# Patient Record
Sex: Female | Born: 1945 | Race: White | Hispanic: No | Marital: Married | Smoking: Former smoker
Health system: Southern US, Community
[De-identification: ages and names within clinical notes are randomized; demographics above are authoritative.]

## PROBLEM LIST (undated history)

## (undated) DIAGNOSIS — J45909 Unspecified asthma, uncomplicated: Secondary | ICD-10-CM

## (undated) DIAGNOSIS — I10 Essential (primary) hypertension: Secondary | ICD-10-CM

## (undated) HISTORY — DX: Essential (primary) hypertension: I10

## (undated) HISTORY — DX: Unspecified asthma, uncomplicated: J45.909

---

## 1996-04-06 HISTORY — PX: BREAST BIOPSY: SHX20

## 2004-12-17 ENCOUNTER — Ambulatory Visit: Payer: Self-pay | Admitting: Unknown Physician Specialty

## 2005-04-13 ENCOUNTER — Ambulatory Visit: Payer: Self-pay | Admitting: Gastroenterology

## 2005-12-31 ENCOUNTER — Ambulatory Visit: Payer: Self-pay | Admitting: Unknown Physician Specialty

## 2006-07-05 ENCOUNTER — Ambulatory Visit: Payer: Self-pay | Admitting: Family Medicine

## 2007-01-06 ENCOUNTER — Ambulatory Visit: Payer: Self-pay | Admitting: Unknown Physician Specialty

## 2007-02-09 ENCOUNTER — Ambulatory Visit: Payer: Self-pay | Admitting: Orthopedic Surgery

## 2007-02-18 ENCOUNTER — Ambulatory Visit: Payer: Self-pay | Admitting: Orthopedic Surgery

## 2008-01-13 ENCOUNTER — Ambulatory Visit: Payer: Self-pay

## 2008-04-06 HISTORY — PX: THUMB ARTHROSCOPY: SHX2509

## 2009-01-14 ENCOUNTER — Ambulatory Visit: Payer: Self-pay | Admitting: Unknown Physician Specialty

## 2010-01-16 ENCOUNTER — Ambulatory Visit: Payer: Self-pay | Admitting: Unknown Physician Specialty

## 2010-09-08 ENCOUNTER — Ambulatory Visit: Payer: Self-pay | Admitting: Otolaryngology

## 2011-01-20 ENCOUNTER — Ambulatory Visit: Payer: Self-pay | Admitting: Unknown Physician Specialty

## 2012-01-12 ENCOUNTER — Ambulatory Visit: Payer: Self-pay | Admitting: Family Medicine

## 2013-01-04 ENCOUNTER — Ambulatory Visit: Payer: Self-pay | Admitting: Family Medicine

## 2013-01-26 ENCOUNTER — Ambulatory Visit: Payer: Self-pay | Admitting: Family Medicine

## 2014-01-01 ENCOUNTER — Ambulatory Visit (INDEPENDENT_AMBULATORY_CARE_PROVIDER_SITE_OTHER): Payer: Federal, State, Local not specified - PPO | Admitting: Internal Medicine

## 2014-01-01 ENCOUNTER — Ambulatory Visit (INDEPENDENT_AMBULATORY_CARE_PROVIDER_SITE_OTHER)
Admission: RE | Admit: 2014-01-01 | Discharge: 2014-01-01 | Disposition: A | Discharge status: Home / Self Care | Payer: Federal, State, Local not specified - PPO | Source: Ambulatory Visit | Attending: Internal Medicine | Admitting: Internal Medicine

## 2014-01-01 ENCOUNTER — Encounter: Payer: Self-pay | Admitting: Internal Medicine

## 2014-01-01 VITALS — BP 140/80 | HR 75 | Temp 98.4°F | Ht 64.5 in | Wt 167.4 lb

## 2014-01-01 DIAGNOSIS — R059 Cough, unspecified: Secondary | ICD-10-CM

## 2014-01-01 DIAGNOSIS — R058 Other specified cough: Secondary | ICD-10-CM

## 2014-01-01 DIAGNOSIS — R05 Cough: Secondary | ICD-10-CM

## 2014-01-01 NOTE — Patient Instructions (Signed)
Stay on omeprazole (prilosec) 40 mg before supper  GERD (REFLUX)  is an extremely common cause of respiratory symptoms, many times with no significant heartburn at all.    It can be treated with medication, but also with lifestyle changes including avoidance of late meals, excessive alcohol, smoking cessation, and avoid fatty foods, chocolate, peppermint, colas, red wine, and acidic juices such as orange juice.  NO MINT OR MENTHOL PRODUCTS SO NO COUGH DROPS  USE SUGARLESS CANDY INSTEAD (jolley ranchers or Pharmacologist)  NO OIL BASED VITAMINS - use powdered substitutes.  Please see patient coordinator before you leave today  to schedule sinus CT   Please remember to go to the lab and x-ray department downstairs for your tests - we will call you with the results when they are available.  Delsym cough syrup is the best you can buy without a prescription

## 2014-01-01 NOTE — Progress Notes (Signed)
Quick Note:  Spoke with pt and notified of results per Dr. Wert. Pt verbalized understanding and denied any questions.  ______ 

## 2014-01-01 NOTE — Assessment & Plan Note (Addendum)
The most common causes of chronic cough in immunocompetent adults include the following: upper airway cough syndrome (UACS), previously referred to as postnasal drip syndrome (PNDS), which is caused by variety of rhinosinus conditions; (2) asthma; (3) GERD; (4) chronic bronchitis from cigarette smoking or other inhaled environmental irritants; (5) nonasthmatic eosinophilic bronchitis; and (6) bronchiectasis.   These conditions, singly or in combination, have accounted for up to 94% of the causes of chronic cough in prospective studies.   Other conditions have constituted no >6% of the causes in prospective studies These have included bronchogenic carcinoma, chronic interstitial pneumonia, sarcoidosis, left ventricular failure, ACEI-induced cough, and aspiration from a condition associated with pharyngeal dysfunction.    Chronic cough is often simultaneously caused by more than one condition. A single cause has been found from 38 to 82% of the time, multiple causes from 18 to 62%. Multiply caused cough has been the result of three diseases up to 42% of the time.       Based on hx and exam, this is most likely:  Classic Upper airway cough syndrome, so named because it's frequently impossible to sort out how much is  CR/sinusitis with freq throat clearing (which can be related to primary GERD)   vs  causing  secondary (" extra esophageal")  GERD from wide swings in gastric pressure that occur with throat clearing, often  promoting self use of mint and menthol lozenges that reduce the lower esophageal sphincter tone and exacerbate the problem further in a cyclical fashion.   These are the same pts (now being labeled as having "irritable larynx syndrome" by some cough centers) who not infrequently have a history of having failed to tolerate ace inhibitors,  dry powder inhalers or biphosphonates or report having atypical reflux symptoms that don't respond to standard doses of PPI , and are easily confused as  having aecopd or asthma flares by even experienced allergists/ pulmonologists.   The first step is to continue maximize acid suppression and  Complete the w/u with allergy profile and sinus ct then regroup if symptoms recur while on max diex/ acid suppression   See instructions for specific recommendations which were reviewed directly with the patient who was given a copy with highlighter outlining the key components.

## 2014-01-01 NOTE — Progress Notes (Signed)
   Subjective:    Patient ID: Zoe Vaughn, female    DOB: November 05, 1945  MRN: [id number]  HPI  6 yowf quit smoking 1985 with tendency to lingering cough with colds lasting a long time since around 2005 typically around Oct / nov lasting up to 2 months at a time and again Jan /feb persisted March 2015 and eval by ENT mid 12/18/13 by shoemaker > rx for reflux so self referred 01/01/2014 to pulmonary clinic   01/01/2014 1st [facility] office visit/ Cristofer Yaffe   Chief Complaint  Patient presents with  . Pulmonary Consult    Self referral. Pt c/o wheezing and cough x 1 yr- started on acid suppression med x 2 wks ago per ENT and symptoms improved.     Cough and wheezing resolved but were quite severe at times since March 2015 until rx with ppi and basically resolved now but concerned about her hoarseness and nasal congestion  No obvious patterns in day to day or daytime variabilty or assoc chronic cough or cp or chest tightness, subjective wheeze overt sinus or hb symptoms. No unusual exp hx or h/o childhood pna/ asthma or knowledge of premature birth.  Sleeping ok without nocturnal  or early am exacerbation  of respiratory  c/o's or need for noct saba. Also denies any obvious fluctuation of symptoms with weather or environmental changes or other aggravating or alleviating factors except as outlined above   Current Medications, Allergies, Complete Past Medical History, Past Surgical History, Family History, and Social History were reviewed in [facility] record.          Review of Systems  Constitutional: Negative for fever, chills and unexpected weight change.  HENT: Negative for congestion, dental problem, ear pain, nosebleeds, postnasal drip, rhinorrhea, sinus pressure, sneezing, sore throat, trouble swallowing and voice change.   Eyes: Negative for visual disturbance.  Respiratory: Negative for cough, choking and shortness of breath.   Cardiovascular: Negative for  chest pain and leg swelling.  Gastrointestinal: Negative for vomiting, abdominal pain and diarrhea.  Genitourinary: Negative for difficulty urinating.  Musculoskeletal: Negative for arthralgias.  Skin: Negative for rash.  Neurological: Negative for tremors, syncope and headaches.  Hematological: Does not bruise/bleed easily.       Objective:   Physical Exam  amb wf with strong nasal tone   Wt Readings from Last 3 Encounters:  01/01/14 167 lb 6.4 oz (75.932 kg)     HEENT: nl dentition,   and orophanx. L turbinates mod severe non-specific swelling. Nl external ear canals without cough reflex   NECK :  without JVD/Nodes/TM/ nl carotid upstrokes bilaterally   LUNGS: no acc muscle use, clear to A and P bilaterally without cough on insp or exp maneuvers   CV:  RRR  no s3 or murmur or increase in P2, no edema   ABD:  soft and nontender with nl excursion in the supine position. No bruits or organomegaly, bowel sounds nl  MS:  warm without deformities, calf tenderness, cyanosis or clubbing  SKIN: warm and dry without lesions    NEURO:  alert, approp, no deficits     CXR  01/01/2014 :   No active cardiopulmonary disease.       Assessment & Plan:

## 2014-01-03 ENCOUNTER — Ambulatory Visit (INDEPENDENT_AMBULATORY_CARE_PROVIDER_SITE_OTHER)
Admission: RE | Admit: 2014-01-03 | Discharge: 2014-01-03 | Disposition: A | Discharge status: Home / Self Care | Payer: Federal, State, Local not specified - PPO | Source: Ambulatory Visit | Attending: Internal Medicine | Admitting: Internal Medicine

## 2014-01-03 DIAGNOSIS — R05 Cough: Secondary | ICD-10-CM

## 2014-01-03 DIAGNOSIS — R058 Other specified cough: Secondary | ICD-10-CM

## 2014-01-03 DIAGNOSIS — R059 Cough, unspecified: Secondary | ICD-10-CM

## 2014-01-04 ENCOUNTER — Encounter: Payer: Self-pay | Admitting: Internal Medicine

## 2014-01-04 NOTE — Progress Notes (Signed)
Quick Note:  Spoke with pt and notified of results per Dr. Wert. Pt verbalized understanding and denied any questions.  ______ 

## 2014-01-08 ENCOUNTER — Telehealth: Payer: Self-pay | Admitting: Internal Medicine

## 2014-01-08 MED ORDER — AMOXICILLIN-POT CLAVULANATE 875-125 MG PO TABS: 1.0000 | ORAL_TABLET | Freq: Two times a day (BID) | ORAL | Status: DC

## 2014-01-08 NOTE — Telephone Encounter (Signed)
Per CT sinus results: Result Notes    Notes Recorded by Christen ButterLeslie M Raskin, CMA on 01/04/2014 at 9:55 AM Spoke with pt and notified of results per Dr. Sherene SiresWert. Pt verbalized understanding and denied any questions.  ------  Notes Recorded by Nyoka CowdenMichael B Wert, MD on 01/04/2014 at 7:44 AM Call patient : Study is C/w sinusitis rec Augmentin 875 mg take one pill twice daily X 21 days - take at breakfast and supper with large glass of water. It would help reduce the usual side effects (diarrhea and yeast infections) if you ate cultured yogurt at lunch.  --  RX was never called in. Called spoke with spouse. RX sent in. Nothing further needed

## 2014-01-15 ENCOUNTER — Ambulatory Visit: Payer: Self-pay

## 2014-12-13 ENCOUNTER — Encounter: Payer: Self-pay | Admitting: Obstetrics & Gynecology

## 2014-12-13 ENCOUNTER — Ambulatory Visit (INDEPENDENT_AMBULATORY_CARE_PROVIDER_SITE_OTHER): Payer: Federal, State, Local not specified - PPO | Admitting: Obstetrics & Gynecology

## 2014-12-13 VITALS — BP 184/90 | HR 74 | Resp 18 | Ht 65.0 in | Wt 169.0 lb

## 2014-12-13 DIAGNOSIS — R030 Elevated blood-pressure reading, without diagnosis of hypertension: Secondary | ICD-10-CM

## 2014-12-13 DIAGNOSIS — Z23 Encounter for immunization: Secondary | ICD-10-CM

## 2014-12-13 DIAGNOSIS — Z01419 Encounter for gynecological examination (general) (routine) without abnormal findings: Secondary | ICD-10-CM

## 2014-12-13 DIAGNOSIS — Z0000 Encounter for general adult medical examination without abnormal findings: Secondary | ICD-10-CM

## 2014-12-13 NOTE — Progress Notes (Signed)
Subjective:    Zoe Vaughn is a 69 y.o.  MW P3 (50, 34, and 39 yo kids, 3 grands) female who presents for an annual exam. The patient has no complaints today. The patient is sexually active. GYN screening history: last pap: was normal. The patient wears seatbelts: yes. The patient participates in regular exercise: yes. Has the patient ever been transfused or tattooed?: no. The patient reports that there is not domestic violence in her life.   Menstrual History: OB History    No data available      Menarche age: 81  No LMP recorded. Patient is postmenopausal.    The following portions of the patient's history were reviewed and updated as appropriate: allergies, current medications, past family history, past medical history, past social history, past surgical history and problem list.  Review of Systems A comprehensive review of systems was negative. She works at [employer]. Married for 47 years. Needs mammogram.   Objective:    BP 182/95 mmHg  Pulse 74  Resp 18  Ht  (1.651 m)  Wt 169 lb (76.658 kg)  BMI 28.12 kg/m2  General Appearance:    Alert, cooperative, no distress, appears stated age  Head:    Normocephalic, without obvious abnormality, atraumatic  Eyes:    PERRL, conjunctiva/corneas clear, EOM's intact, fundi    benign, both eyes  Ears:    Normal TM's and external ear canals, both ears  Nose:   Nares normal, septum midline, mucosa normal, no drainage    or sinus tenderness  Throat:   Lips, mucosa, and tongue normal; teeth and gums normal  Neck:   Supple, symmetrical, trachea midline, no adenopathy;    thyroid:  no enlargement/tenderness/nodules; no carotid   bruit or JVD  Back:     Symmetric, no curvature, ROM normal, no CVA tenderness  Lungs:     Clear to auscultation bilaterally, respirations unlabored  Chest Wall:    No tenderness or deformity   Heart:    Regular rate and rhythm, S1 and S2 normal, no murmur, rub   or gallop  Breast Exam:    No tenderness,  masses, or nipple abnormality  Abdomen:     Soft, non-tender, bowel sounds active all four quadrants,    no masses, no organomegaly  Genitalia:    Normal female without lesion, discharge or tenderness. NSSA, NT, mobile, normal adnexal exam     Extremities:   Extremities normal, atraumatic, no cyanosis or edema  Pulses:   2+ and symmetric all extremities  Skin:   Skin color, texture, turgor normal, no rashes or lesions  Lymph nodes:   Cervical, supraclavicular, and axillary nodes normal  Neurologic:   CNII-XII intact, normal strength, sensation and reflexes    throughout  .    Assessment:    Healthy female exam.   Elevated BP   Plan:     Breast self exam technique reviewed and patient encouraged to perform self-exam monthly. Mammogram. flu vaccine   Fasting labs at her convenience I have strongly advised her to go to a FP/IM for management of her BP. We discussed "silent Killer" BP and risks.

## 2014-12-19 ENCOUNTER — Other Ambulatory Visit: Payer: Federal, State, Local not specified - PPO | Admitting: *Deleted

## 2014-12-19 LAB — CBC
HEMATOCRIT: 41.2 % (ref 36.0–46.0)
HEMOGLOBIN: 14 g/dL (ref 12.0–15.0)
MCH: 30.4 pg (ref 26.0–34.0)
MCHC: 34 g/dL (ref 30.0–36.0)
MCV: 89.4 fL (ref 78.0–100.0)
MPV: 10.2 fL (ref 8.6–12.4)
Platelets: 231 10*3/uL (ref 150–400)
RBC: 4.61 MIL/uL (ref 3.87–5.11)
RDW: 13.8 % (ref 11.5–15.5)
WBC: 6.4 10*3/uL (ref 4.0–10.5)

## 2014-12-20 LAB — COMPREHENSIVE METABOLIC PANEL
ALBUMIN: 4.3 g/dL (ref 3.6–5.1)
ALK PHOS: 65 U/L (ref 33–130)
ALT: 20 U/L (ref 6–29)
AST: 18 U/L (ref 10–35)
BILIRUBIN TOTAL: 0.6 mg/dL (ref 0.2–1.2)
BUN: 17 mg/dL (ref 7–25)
CALCIUM: 9.8 mg/dL (ref 8.6–10.4)
CO2: 24 mmol/L (ref 20–31)
CREATININE: 0.77 mg/dL (ref 0.60–0.93)
Chloride: 100 mmol/L (ref 98–110)
Glucose, Bld: 102 mg/dL — ABNORMAL HIGH (ref 65–99)
Potassium: 4.2 mmol/L (ref 3.5–5.3)
SODIUM: 137 mmol/L (ref 135–146)
Total Protein: 7 g/dL (ref 6.1–8.1)

## 2014-12-20 LAB — LIPID PANEL
CHOLESTEROL: 218 mg/dL — AB (ref 125–200)
HDL: 63 mg/dL (ref >=46)
LDL Cholesterol: 138 mg/dL — ABNORMAL HIGH (ref <130)
Total CHOL/HDL Ratio: 3.5 Ratio (ref <=5.0)
Triglycerides: 85 mg/dL (ref <150)
VLDL: 17 mg/dL (ref <30)

## 2014-12-20 LAB — TSH: TSH: 1.923 u[IU]/mL (ref 0.350–4.500)

## 2014-12-31 ENCOUNTER — Ambulatory Visit (INDEPENDENT_AMBULATORY_CARE_PROVIDER_SITE_OTHER)
Admission: RE | Admit: 2014-12-31 | Discharge: 2014-12-31 | Disposition: A | Discharge status: Home / Self Care | Payer: Federal, State, Local not specified - PPO | Source: Ambulatory Visit | Attending: Primary Care | Admitting: Primary Care

## 2014-12-31 ENCOUNTER — Encounter: Payer: Self-pay | Admitting: Primary Care

## 2014-12-31 ENCOUNTER — Ambulatory Visit (INDEPENDENT_AMBULATORY_CARE_PROVIDER_SITE_OTHER): Payer: Federal, State, Local not specified - PPO | Admitting: Primary Care

## 2014-12-31 VITALS — BP 154/94 | HR 70 | Temp 97.7°F | Ht 65.0 in | Wt 166.4 lb

## 2014-12-31 DIAGNOSIS — I10 Essential (primary) hypertension: Secondary | ICD-10-CM | POA: Insufficient documentation

## 2014-12-31 DIAGNOSIS — R05 Cough: Secondary | ICD-10-CM | POA: Diagnosis not present

## 2014-12-31 DIAGNOSIS — R059 Cough, unspecified: Secondary | ICD-10-CM

## 2014-12-31 DIAGNOSIS — R0602 Shortness of breath: Secondary | ICD-10-CM | POA: Diagnosis not present

## 2014-12-31 DIAGNOSIS — K219 Gastro-esophageal reflux disease without esophagitis: Secondary | ICD-10-CM

## 2014-12-31 MED ORDER — ALBUTEROL SULFATE HFA 108 (90 BASE) MCG/ACT IN AERS: 2.0000 | INHALATION_SPRAY | Freq: Four times a day (QID) | RESPIRATORY_TRACT | Status: DC | PRN

## 2014-12-31 MED ORDER — LOSARTAN POTASSIUM 50 MG PO TABS: 50.0000 mg | ORAL_TABLET | Freq: Every day | ORAL | Status: DC

## 2014-12-31 NOTE — Assessment & Plan Note (Signed)
Two documented elevated readings including clinic today as well as [facility] readings. Will start Losartan 50 mg daily. Follow up in 2 weeks for re-evaluation.

## 2014-12-31 NOTE — Patient Instructions (Signed)
Complete lab work prior to leaving today. I will notify you of your results.  Complete xray(s) prior to leaving today. I will contact you regarding your results.  Start losartan tablets for blood pressure. Take 1 tablet by mouth daily.  Start using the albuterol inhaler as needed for wheezing and shortness of breath. Inhale 2 puffs every 6 hours as needed.  Restart your generic Claritin and take this daily for 2 weeks.  Follow up in 2 weeks for re-evaluation of blood pressure and shortness of breath.  It was a pleasure to meet you today! Please don't hesitate to call me with any questions. Welcome to [facility]!  Hypertension Hypertension, commonly called high blood pressure, is when the force of blood pumping through your arteries is too strong. Your arteries are the blood vessels that carry blood from your heart throughout your body. A blood pressure reading consists of a higher number over a lower number, such as 110/72. The higher number (systolic) is the pressure inside your arteries when your heart pumps. The lower number (diastolic) is the pressure inside your arteries when your heart relaxes. Ideally you want your blood pressure below 120/80. Hypertension forces your heart to work harder to pump blood. Your arteries may become narrow or stiff. Having hypertension puts you at risk for heart disease, stroke, and other problems.  RISK FACTORS Some risk factors for high blood pressure are controllable. Others are not.  Risk factors you cannot control include:   Race. You may be at higher risk if you are African American.  Age. Risk increases with age.  Gender. Men are at higher risk than women before age 108 years. After age 45, women are at higher risk than men. Risk factors you can control include:  Not getting enough exercise or physical activity.  Being overweight.  Getting too much fat, sugar, calories, or salt in your diet.  Drinking too much alcohol. SIGNS AND  SYMPTOMS Hypertension does not usually cause signs or symptoms. Extremely high blood pressure (hypertensive crisis) may cause headache, anxiety, shortness of breath, and nosebleed. DIAGNOSIS  To check if you have hypertension, your health care provider will measure your blood pressure while you are seated, with your arm held at the level of your heart. It should be measured at least twice using the same arm. Certain conditions can cause a difference in blood pressure between your right and left arms. A blood pressure reading that is higher than normal on one occasion does not mean that you need treatment. If one blood pressure reading is high, ask your health care provider about having it checked again. TREATMENT  Treating high blood pressure includes making lifestyle changes and possibly taking medicine. Living a healthy lifestyle can help lower high blood pressure. You may need to change some of your habits. Lifestyle changes may include:  Following the DASH diet. This diet is high in fruits, vegetables, and whole grains. It is low in salt, red meat, and added sugars.  Getting at least 2 hours of brisk physical activity every week.  Losing weight if necessary.  Not smoking.  Limiting alcoholic beverages.  Learning ways to reduce stress. If lifestyle changes are not enough to get your blood pressure under control, your health care provider may prescribe medicine. You may need to take more than one. Work closely with your health care provider to understand the risks and benefits. HOME CARE INSTRUCTIONS  Have your blood pressure rechecked as directed by your health care provider.   Take  medicines only as directed by your health care provider. Follow the directions carefully. Blood pressure medicines must be taken as prescribed. The medicine does not work as well when you skip doses. Skipping doses also puts you at risk for problems.   Do not smoke.   Monitor your blood pressure at  home as directed by your health care provider. SEEK MEDICAL CARE IF:   You think you are having a reaction to medicines taken.  You have recurrent headaches or feel dizzy.  You have swelling in your ankles.  You have trouble with your vision. SEEK IMMEDIATE MEDICAL CARE IF:  You develop a severe headache or confusion.  You have unusual weakness, numbness, or feel faint.  You have severe chest or abdominal pain.  You vomit repeatedly.  You have trouble breathing. MAKE SURE YOU:   Understand these instructions.  Will watch your condition.  Will get help right away if you are not doing well or get worse. Document Released: 03/23/2005 Document Revised: 08/07/2013 Document Reviewed: 01/13/2013 [facility] Patient Information 2015 [place], [place]. This information is not intended to replace advice given to you by your health care provider. Make sure you discuss any questions you have with your health care provider.

## 2014-12-31 NOTE — Progress Notes (Signed)
Subjective:    Patient ID: Zoe Vaughn, female    DOB: Dec 27, 1945, 69 y.o.   MRN: [id number]  HPI  Zoe Vaughn is a 69 year old female who presents today to establish care and discuss the problems mentioned below. Will review old records.  1) Cough: Intermittent since 2005. Sometimes productive with clear sputum. She also reports wheezing, fatigue, sinus pressure, shortness of breath, and headache more recently. Symptoms have been present for the past 2 months and have been constant. Denies fevers. She was evaluated by Dr. Sherene Sires in September 2015 and determined to have upper airway cough syndrome. She underwent CT of the maxillofacial region and was determined to have sinusitis. She smoked less than 1 PPD for 10-15 years, quit in 1985.  She was also followed by ENT who discovered symptoms of reflux and initiated omeprazole 40 mg. She took the omeprazole daily for 1 week in 2015 with resolve in symptoms. She took the omeprazole for about 1 week recently and had no resolve in symptoms. She recently started taking [facility] brand of claritin which provided relief of sneezing and ear fullness. She took this for 3 days and then stopped.  2) Elevated Blood Pressure Readings: Elevated reading at GYN office on September 8th 2016 and then [facility] later that day. Her readings at [facility] and [facility] were 180's/90's. She has checked her BP [facility] several times since and has been getting 150/90's. She has occasional throbbing to her head. Denies chest pain.  3) GERD: Once managed on omeprazole 40 mg daily for one week last year in September which helped to relieve her symptoms of esophageal burning and cough. She started taking this again for 1 week without relief.  Review of Systems  HENT: Positive for congestion and sinus pressure. Negative for ear pain and sore throat.   Respiratory: Positive for cough, shortness of breath and wheezing.   Cardiovascular: Negative for chest pain.  Gastrointestinal: Negative for  diarrhea and constipation.  Genitourinary: Negative for difficulty urinating.  Musculoskeletal: Negative for myalgias and arthralgias.  Skin: Negative for rash.  Allergic/Immunologic: Negative for environmental allergies.  Neurological: Positive for headaches. Negative for dizziness and numbness.  Psychiatric/Behavioral:       Denies concerns for anxiety or depression       History reviewed. No pertinent past medical history.  Social History   Social History  . Marital Status: Married    Spouse Name: N/A  . Number of Children: N/A  . Years of Education: N/A   Occupational History  . Not on file.   Social History Main Topics  . Smoking status: Former Smoker -- 0.50 packs/day for 20 years    Types: Cigarettes    Quit date: 04/07/1983  . Smokeless tobacco: Not on file  . Alcohol Use: No  . Drug Use: No  . Sexual Activity: Not on file   Other Topics Concern  . Not on file   Social History Narrative    Past Surgical History  Procedure Laterality Date  . Thumb arthroscopy  2010  . Breast biopsy Right 1998    Family History  Problem Relation Age of Onset  . Emphysema Maternal Grandfather     unsure if he smoked  . Lung cancer Sister     smoked  . Arthritis Sister   . Arthritis Mother   . Alcohol abuse Father     No Known Allergies  No current outpatient prescriptions on file prior to visit.   No  current facility-administered medications on file prior to visit.    BP 154/94 mmHg  Pulse 70  Temp(Src) 97.7 F (36.5 C) (Oral)  Ht  (1.651 m)  Wt 166 lb 6.4 oz (75.479 kg)  BMI 27.69 kg/m2  SpO2 96%    Objective:   Physical Exam  Constitutional: She appears well-nourished.  HENT:  Right Ear: Tympanic membrane is bulging. Tympanic membrane is not injected and not erythematous.  Left Ear: Tympanic membrane is bulging. Tympanic membrane is not injected and not erythematous.  Nose: Right sinus exhibits no maxillary sinus tenderness and no frontal  sinus tenderness. Left sinus exhibits no maxillary sinus tenderness and no frontal sinus tenderness.  Mouth/Throat: Oropharynx is clear and moist.  Eyes: Conjunctivae are normal. Pupils are equal, round, and reactive to light.  Neck: Neck supple.  Cardiovascular: Normal rate and regular rhythm.   Pulmonary/Chest: No tachypnea. She has wheezes in the right upper field and the left upper field. She has rhonchi in the right upper field, the right lower field, the left upper field and the left lower field.  Lymphadenopathy:    She has no cervical adenopathy.  Skin: Skin is warm and dry.  Psychiatric: She has a normal mood and affect.          Assessment & Plan:  Cough:  Present for 2 months consistently, longstanding history of this in past. Evaluated by Pulmonology and ENT, advised to start omeprazole and antibiotics for sinusitis. Currently not taking omeprazole.  Exam with rhonchi and wheezing throughout.  RX for albuterol inhaler. Suggested she restart OTC claritin and her omeprazole. Xray pending today, CBC pending. Close follow up in 2 weeks for re-evaluation.

## 2014-12-31 NOTE — Assessment & Plan Note (Signed)
Initiated by ENT 1 year ago for suspected GERD. She is taking PRN, last does 1 week ago without relief.  She has omeprazole 40 mg at home and encouraged to restart due to cough.

## 2014-12-31 NOTE — Progress Notes (Signed)
Pre visit review using our clinic review tool, if applicable. No additional management support is needed unless otherwise documented below in the visit note. 

## 2015-01-01 ENCOUNTER — Telehealth: Payer: Self-pay | Admitting: *Deleted

## 2015-01-01 ENCOUNTER — Other Ambulatory Visit: Payer: Self-pay | Admitting: Primary Care

## 2015-01-01 DIAGNOSIS — R059 Cough, unspecified: Secondary | ICD-10-CM

## 2015-01-01 DIAGNOSIS — R05 Cough: Secondary | ICD-10-CM

## 2015-01-01 LAB — CBC WITH DIFFERENTIAL/PLATELET
BASOS PCT: 0.6 % (ref 0.0–3.0)
Basophils Absolute: 0 10*3/uL (ref 0.0–0.1)
EOS ABS: 0.5 10*3/uL (ref 0.0–0.7)
Eosinophils Relative: 7.4 % — ABNORMAL HIGH (ref 0.0–5.0)
HEMATOCRIT: 41.2 % (ref 36.0–46.0)
Hemoglobin: 13.7 g/dL (ref 12.0–15.0)
LYMPHS PCT: 33.6 % (ref 12.0–46.0)
Lymphs Abs: 2.2 10*3/uL (ref 0.7–4.0)
MCHC: 33.3 g/dL (ref 30.0–36.0)
MCV: 90.3 fl (ref 78.0–100.0)
MONO ABS: 0.7 10*3/uL (ref 0.1–1.0)
Monocytes Relative: 11.2 % (ref 3.0–12.0)
NEUTROS ABS: 3.2 10*3/uL (ref 1.4–7.7)
Neutrophils Relative %: 47.2 % (ref 43.0–77.0)
PLATELETS: 248 10*3/uL (ref 150.0–400.0)
RBC: 4.56 Mil/uL (ref 3.87–5.11)
RDW: 12.7 % (ref 11.5–15.5)
WBC: 6.7 10*3/uL (ref 4.0–10.5)

## 2015-01-01 MED ORDER — DOXYCYCLINE HYCLATE 100 MG PO TABS: 100.0000 mg | ORAL_TABLET | Freq: Two times a day (BID) | ORAL | Status: DC

## 2015-01-01 NOTE — Telephone Encounter (Signed)
-----   Message from Allie Bossier, MD sent at 01/01/2015 10:50 AM EDT ----- She will need a low fat diet letter.

## 2015-01-01 NOTE — Telephone Encounter (Signed)
Informed pt of Total cholesterol and LDL results and the recommendation to follow a low fat/cholesterol diet.  Reviewed what foods to avoid, pt acknowledged instructions.

## 2015-01-07 ENCOUNTER — Ambulatory Visit
Admission: RE | Admit: 2015-01-07 | Discharge: 2015-01-07 | Disposition: A | Discharge status: Home / Self Care | Payer: Federal, State, Local not specified - PPO | Source: Ambulatory Visit | Attending: Obstetrics & Gynecology | Admitting: Obstetrics & Gynecology

## 2015-01-07 DIAGNOSIS — Z0000 Encounter for general adult medical examination without abnormal findings: Secondary | ICD-10-CM

## 2015-01-07 DIAGNOSIS — Z1231 Encounter for screening mammogram for malignant neoplasm of breast: Secondary | ICD-10-CM | POA: Insufficient documentation

## 2015-01-09 ENCOUNTER — Telehealth: Payer: Self-pay

## 2015-01-09 NOTE — Telephone Encounter (Signed)
Pt was seen 12/31/14;pt will finish abx on 01/10/15. Pt can see improvement in prod cough with clear phlegm and wheezing as long as using inhaler at nighttime. No fever, sinus pressure comes and goes. Pt continues to be very tired and low energy. No SOB now. Pt has f/u appt on 01/16/15 and pt wants to verify OK to not take any more abx when finishes med on 01/10/15. Pt is taking generic claritin daily. Pt request cb if needs further abx. [facility] [place].

## 2015-01-09 NOTE — Telephone Encounter (Signed)
No further antibiotics are required at this time. Will see her for follow up next week.

## 2015-01-16 ENCOUNTER — Ambulatory Visit (INDEPENDENT_AMBULATORY_CARE_PROVIDER_SITE_OTHER): Payer: Federal, State, Local not specified - PPO | Admitting: Primary Care

## 2015-01-16 ENCOUNTER — Ambulatory Visit (INDEPENDENT_AMBULATORY_CARE_PROVIDER_SITE_OTHER)
Admission: RE | Admit: 2015-01-16 | Discharge: 2015-01-16 | Disposition: A | Discharge status: Home / Self Care | Payer: Federal, State, Local not specified - PPO | Source: Ambulatory Visit | Attending: Primary Care | Admitting: Primary Care

## 2015-01-16 ENCOUNTER — Encounter: Payer: Self-pay | Admitting: Primary Care

## 2015-01-16 VITALS — BP 130/78 | HR 67 | Temp 97.5°F | Wt 165.5 lb

## 2015-01-16 DIAGNOSIS — R053 Chronic cough: Secondary | ICD-10-CM

## 2015-01-16 DIAGNOSIS — R062 Wheezing: Secondary | ICD-10-CM

## 2015-01-16 DIAGNOSIS — I10 Essential (primary) hypertension: Secondary | ICD-10-CM | POA: Diagnosis not present

## 2015-01-16 DIAGNOSIS — R05 Cough: Secondary | ICD-10-CM

## 2015-01-16 DIAGNOSIS — R058 Other specified cough: Secondary | ICD-10-CM

## 2015-01-16 LAB — BASIC METABOLIC PANEL
BUN: 16 mg/dL (ref 6–23)
CALCIUM: 9.7 mg/dL (ref 8.4–10.5)
CHLORIDE: 102 meq/L (ref 96–112)
CO2: 28 mEq/L (ref 19–32)
CREATININE: 0.88 mg/dL (ref 0.40–1.20)
GFR: 67.32 mL/min (ref >60.00)
Glucose, Bld: 123 mg/dL — ABNORMAL HIGH (ref 70–99)
Potassium: 4.4 mEq/L (ref 3.5–5.1)
Sodium: 137 mEq/L (ref 135–145)

## 2015-01-16 MED ORDER — FLUTICASONE PROPIONATE HFA 44 MCG/ACT IN AERO: 2.0000 | INHALATION_SPRAY | Freq: Two times a day (BID) | RESPIRATORY_TRACT | Status: DC

## 2015-01-16 NOTE — Progress Notes (Signed)
Subjective:    Patient ID: Zoe Vaughn, female    DOB: 1946/01/11, 69 y.o.   MRN: [id number]  HPI  Ms. Zoe Vaughn is a 69 year old female who presents today for follow up of hypertension. She was evaluated 2 weeks ago and noted to have an elevated blood pressure reading. She endorsed several elevated readings in the past that were 150-180/90's. Last visit she was initiated her on Losartan 50 mg daily.  BP Readings from Last 3 Encounters:  01/16/15 130/78  12/31/14 154/94  12/13/14 184/90     Since her last visit her blood pressure has improved. She denies chest pain, shortness of breath, headaches.  2) Chronic cough: She also reported a chronic cough last visit and was advised to start taking omeprazole and claritin. She continues to experience the cough, sinus pressure, nasal congestion, wheezing. She completed the complete course of antibiotics that were provided last visit due to probable infection noted on xray. She's currently using her albuterol inhaler every morning and every night due to wheezing and cough. She is a former smoker who quit in 1985. She is frustrated as she has not felt a sense of relief, despite evaluation by ENT. She's never undergone PFT for asthma or emphysema and has not had allergy testing.  Review of Systems  Constitutional: Negative for fever.  HENT: Positive for congestion and postnasal drip. Negative for sinus pressure and sore throat.   Respiratory: Positive for cough and wheezing. Negative for shortness of breath.   Cardiovascular: Negative for chest pain.  Neurological: Negative for dizziness and headaches.       No past medical history on file.  Social History   Social History  . Marital Status: Married    Spouse Name: N/A  . Number of Children: N/A  . Years of Education: N/A   Occupational History  . Not on file.   Social History Main Topics  . Smoking status: Former Smoker -- 0.50 packs/day for 20 years    Types: Cigarettes   Quit date: 04/07/1983  . Smokeless tobacco: Not on file  . Alcohol Use: No  . Drug Use: No  . Sexual Activity: Not on file   Other Topics Concern  . Not on file   Social History Narrative    Past Surgical History  Procedure Laterality Date  . Thumb arthroscopy  2010  . Breast biopsy Right 1998    neg    Family History  Problem Relation Age of Onset  . Emphysema Maternal Grandfather     unsure if he smoked  . Lung cancer Sister     smoked  . Arthritis Sister   . Arthritis Mother   . Alcohol abuse Father     No Known Allergies  Current Outpatient Prescriptions on File Prior to Visit  Medication Sig Dispense Refill  . albuterol (PROAIR HFA) 108 (90 BASE) MCG/ACT inhaler Inhale 2 puffs into the lungs every 6 (six) hours as needed for wheezing or shortness of breath. 1 Inhaler 5  . losartan (COZAAR) 50 MG tablet Take 1 tablet (50 mg total) by mouth daily. 30 tablet 3  . omeprazole (PRILOSEC) 40 MG capsule Take by mouth daily as needed.      No current facility-administered medications on file prior to visit.    BP 130/78 mmHg  Pulse 67  Temp(Src) 97.5 F (36.4 C) (Oral)  Wt 165 lb 8 oz (75.07 kg)  SpO2 96%    Objective:   Physical Exam  Constitutional:  She appears well-nourished.  Cardiovascular: Normal rate and regular rhythm.   Pulmonary/Chest: Effort normal. She has no decreased breath sounds. She has no wheezes. She has rales in the right upper field, the left upper field and the left lower field.  Skin: Skin is warm and dry.          Assessment & Plan:

## 2015-01-16 NOTE — Assessment & Plan Note (Addendum)
Continues to cough without relief from daily claritin and omeprazole. She experiences wheezing every morning and at night. She is using albuterol inhaler twice daily with improvement. Suspect her symptoms may be due to asthma or emphysema/COPD. Will start low dose ICS inhaler due to frequent albuterol use. Also sent to allergist for testing and PFT's. Will repeat chest xray as recommended.

## 2015-01-16 NOTE — Progress Notes (Signed)
Pre visit review using our clinic review tool, if applicable. No additional management support is needed unless otherwise documented below in the visit note. 

## 2015-01-16 NOTE — Assessment & Plan Note (Signed)
Improved with initiation of losartan 50 mg. Continue same. BMP today.

## 2015-01-16 NOTE — Patient Instructions (Addendum)
Start Flovent Inhaler for chronic cough and wheezing. Inhale 2 puffs twice daily everyday.  Only use the albuterol inhaler as needed for breakthrough wheezing or shortness of breath.  Complete xray(s) prior to leaving today. I will contact you regarding your results.  Call me in 2 weeks to notify me how you're doing.  Stop by the front and speak with Shirlee LimerickMarion regarding your referral to the allergist.  Continue Losartan for blood pressure.  Complete lab work prior to leaving today. I will notify you of your results.   It was a pleasure to see you today!  Asthma, Adult Asthma is a recurring condition in which the airways tighten and narrow. Asthma can make it difficult to breathe. It can cause coughing, wheezing, and shortness of breath. Asthma episodes, also called asthma attacks, range from minor to life-threatening. Asthma cannot be cured, but medicines and lifestyle changes can help control it. CAUSES Asthma is believed to be caused by inherited (genetic) and environmental factors, but its exact cause is unknown. Asthma may be triggered by allergens, lung infections, or irritants in the air. Asthma triggers are different for each person. Common triggers include:   Animal dander.  Dust mites.  Cockroaches.  Pollen from trees or grass.  Mold.  Smoke.  Air pollutants such as dust, household cleaners, hair sprays, aerosol sprays, paint fumes, strong chemicals, or strong odors.  Cold air, weather changes, and winds (which increase molds and pollens in the air).  Strong emotional expressions such as crying or laughing hard.  Stress.  Certain medicines (such as aspirin) or types of drugs (such as beta-blockers).  Sulfites in foods and drinks. Foods and drinks that may contain sulfites include dried fruit, potato chips, and sparkling grape juice.  Infections or inflammatory conditions such as the flu, a cold, or an inflammation of the nasal membranes  (rhinitis).  Gastroesophageal reflux disease (GERD).  Exercise or strenuous activity. SYMPTOMS Symptoms may occur immediately after asthma is triggered or many hours later. Symptoms include:  Wheezing.  Excessive nighttime or early morning coughing.  Frequent or severe coughing with a common cold.  Chest tightness.  Shortness of breath. DIAGNOSIS  The diagnosis of asthma is made by a review of your medical history and a physical exam. Tests may also be performed. These may include:  Lung function studies. These tests show how much air you breathe in and out.  Allergy tests.  Imaging tests such as X-rays. TREATMENT  Asthma cannot be cured, but it can usually be controlled. Treatment involves identifying and avoiding your asthma triggers. It also involves medicines. There are 2 classes of medicine used for asthma treatment:   Controller medicines. These prevent asthma symptoms from occurring. They are usually taken every day.  Reliever or rescue medicines. These quickly relieve asthma symptoms. They are used as needed and provide short-term relief. Your health care provider will help you create an asthma action plan. An asthma action plan is a written plan for managing and treating your asthma attacks. It includes a list of your asthma triggers and how they may be avoided. It also includes information on when medicines should be taken and when their dosage should be changed. An action plan may also involve the use of a device called a peak flow meter. A peak flow meter measures how well the lungs are working. It helps you monitor your condition. HOME CARE INSTRUCTIONS   Take medicines only as directed by your health care provider. Speak with your health care provider  if you have questions about how or when to take the medicines.  Use a peak flow meter as directed by your health care provider. Record and keep track of readings.  Understand and use the action plan to help minimize  or stop an asthma attack without needing to seek medical care.  Control your home environment in the following ways to help prevent asthma attacks:  Do not smoke. Avoid being exposed to secondhand smoke.  Change your heating and air conditioning filter regularly.  Limit your use of fireplaces and wood stoves.  Get rid of pests (such as roaches and mice) and their droppings.  Throw away plants if you see mold on them.  Clean your floors and dust regularly. Use unscented cleaning products.  Try to have someone else vacuum for you regularly. Stay out of rooms while they are being vacuumed and for a short while afterward. If you vacuum, use a dust mask from a hardware store, a double-layered or microfilter vacuum cleaner bag, or a vacuum cleaner with a HEPA filter.  Replace carpet with wood, tile, or vinyl flooring. Carpet can trap dander and dust.  Use allergy-proof pillows, mattress covers, and box spring covers.  Wash bed sheets and blankets every week in hot water and dry them in a dryer.  Use blankets that are made of polyester or cotton.  Clean bathrooms and kitchens with bleach. If possible, have someone repaint the walls in these rooms with mold-resistant paint. Keep out of the rooms that are being cleaned and painted.  Wash hands frequently. SEEK MEDICAL CARE IF:   You have wheezing, shortness of breath, or a cough even if taking medicine to prevent attacks.  The colored mucus you cough up (sputum) is thicker than usual.  Your sputum changes from clear or white to yellow, green, gray, or bloody.  You have any problems that may be related to the medicines you are taking (such as a rash, itching, swelling, or trouble breathing).  You are using a reliever medicine more than 2-3 times per week.  Your peak flow is still at 50-79% of your personal best after following your action plan for 1 hour.  You have a fever. SEEK IMMEDIATE MEDICAL CARE IF:   You seem to be getting  worse and are unresponsive to treatment during an asthma attack.  You are short of breath even at rest.  You get short of breath when doing very little physical activity.  You have difficulty eating, drinking, or talking due to asthma symptoms.  You develop chest pain.  You develop a fast heartbeat.  You have a bluish color to your lips or fingernails.  You are light-headed, dizzy, or faint.  Your peak flow is less than 50% of your personal best.   This information is not intended to replace advice given to you by your health care provider. Make sure you discuss any questions you have with your health care provider.   Document Released: 03/23/2005 Document Revised: 12/12/2014 Document Reviewed: 10/20/2012 Elsevier Interactive Patient Education [employer]

## 2015-01-30 ENCOUNTER — Telehealth: Payer: Self-pay | Admitting: Primary Care

## 2015-01-30 NOTE — Telephone Encounter (Signed)
Called patient. Patient stated that she have no more wheezing. She feels much better. However, patient noticed constant phlegm in the back of her throat. It is just noticeable.

## 2015-01-30 NOTE — Telephone Encounter (Signed)
Message left for patient to return my call.  

## 2015-01-30 NOTE — Telephone Encounter (Signed)
Please notify her to rinse her mouth with water each time she uses her inhaler and to notify me if the phlegm doesn't improve.

## 2015-01-30 NOTE — Telephone Encounter (Signed)
Will you please call and check on Zoe Vaughn? How's her cough and wheezing? Has she started the Flovent inhaler?

## 2015-02-01 ENCOUNTER — Telehealth: Payer: Self-pay | Admitting: Primary Care

## 2015-02-01 NOTE — Telephone Encounter (Signed)
Called patient. Patient stated she read the directions and already is rinsing her her mouth each time. However, still have phlegm but patient stated she will let us know if worsen.

## 2015-02-01 NOTE — Telephone Encounter (Signed)
Noted  

## 2015-06-11 ENCOUNTER — Other Ambulatory Visit: Payer: Self-pay | Admitting: Primary Care

## 2015-06-11 DIAGNOSIS — I10 Essential (primary) hypertension: Secondary | ICD-10-CM

## 2015-06-11 NOTE — Telephone Encounter (Signed)
Electronically refill request for   losartan (COZAAR) 50 MG tablet   Take 1 tablet (50 mg total) by mouth daily.  Dispense: 30 tablet   Refills: 3     Last prescribed on  12/31/2014. Last seen on 01/16/2015. No future appointment.

## 2016-01-02 ENCOUNTER — Other Ambulatory Visit (HOSPITAL_COMMUNITY): Payer: Self-pay | Admitting: Obstetrics & Gynecology

## 2016-01-02 DIAGNOSIS — Z1231 Encounter for screening mammogram for malignant neoplasm of breast: Secondary | ICD-10-CM

## 2016-01-10 ENCOUNTER — Encounter: Payer: Self-pay | Admitting: Obstetrics & Gynecology

## 2016-01-10 ENCOUNTER — Ambulatory Visit (INDEPENDENT_AMBULATORY_CARE_PROVIDER_SITE_OTHER): Payer: Federal, State, Local not specified - PPO | Admitting: Obstetrics & Gynecology

## 2016-01-10 VITALS — BP 168/71 | HR 83 | Resp 18 | Ht 65.5 in | Wt 166.0 lb

## 2016-01-10 DIAGNOSIS — Z01419 Encounter for gynecological examination (general) (routine) without abnormal findings: Secondary | ICD-10-CM | POA: Diagnosis not present

## 2016-01-10 NOTE — Progress Notes (Signed)
Pt BP 168/71, has Losartan rx, pt states she is not taking it daily, took it twice this week.  Counseled that BP medications are most effective when taken daily, encouraged pt to continue it daily.

## 2016-01-10 NOTE — Progress Notes (Signed)
Subjective:    Zoe Vaughn is a 70 y.o. (3 grands) female who presents for an annual exam. The patient has no complaints today. The patient is sexually active. GYN screening history: last pap: was normal. The patient wears seatbelts: yes. The patient participates in regular exercise: yes. Has the patient ever been transfused or tattooed?: no. The patient reports that there is not domestic violence in her life.   Menstrual History: OB History    No data available      Menarche age: 89 No LMP recorded. Patient is postmenopausal. Menopausal at about 8040s    The following portions of the patient's history were reviewed and updated as appropriate: allergies, current medications, past family history, past medical history, past social history, past surgical history and problem list.  Review of Systems Pertinent items are noted in HPI. Married for 48 years. No dyspareunia. No FH of breast/gyn/colon cancer   Objective:    BP (!) 168/71 (BP Location: Left Arm, Patient Position: Sitting, Cuff Size: Normal)   Pulse 83   Resp 18   Ht 5' 5.5" (1.664 m)   Wt 166 lb (75.3 kg)   BMI 27.20 kg/m   General Appearance:    Alert, cooperative, no distress, appears stated age  Head:    Normocephalic, without obvious abnormality, atraumatic  Eyes:    PERRL, conjunctiva/corneas clear, EOM's intact, fundi    benign, both eyes  Ears:    Normal TM's and external ear canals, both ears  Nose:   Nares normal, septum midline, mucosa normal, no drainage    or sinus tenderness  Throat:   Lips, mucosa, and tongue normal; teeth and gums normal  Neck:   Supple, symmetrical, trachea midline, no adenopathy;    thyroid:  no enlargement/tenderness/nodules; no carotid   bruit or JVD  Back:     Symmetric, no curvature, ROM normal, no CVA tenderness  Lungs:     Clear to auscultation bilaterally, respirations unlabored  Chest Wall:    No tenderness or deformity   Heart:    Regular rate and rhythm, S1 and S2  normal, no murmur, rub   or gallop  Breast Exam:    No tenderness, masses, or nipple abnormality  Abdomen:     Soft, non-tender, bowel sounds active all four quadrants,    no masses, no organomegaly  Genitalia:    Normal female without lesion, discharge or tenderness, NSSR, NT, no palpable masses     Extremities:   Extremities normal, atraumatic, no cyanosis or edema  Pulses:   2+ and symmetric all extremities  Skin:   Skin color, texture, turgor normal, no rashes or lesions  Lymph nodes:   Cervical, supraclavicular, and axillary nodes normal  Neurologic:   CNII-XII intact, normal strength, sensation and reflexes    throughout   .    Assessment:    Healthy female exam.    Plan:     Discussed ACOG recs Discussed risks of STROKE if she continues to not take her meds.

## 2016-01-13 ENCOUNTER — Encounter: Payer: Self-pay | Admitting: *Deleted

## 2016-01-23 ENCOUNTER — Ambulatory Visit
Admission: RE | Admit: 2016-01-23 | Discharge: 2016-01-23 | Disposition: A | Discharge status: Home / Self Care | Payer: Federal, State, Local not specified - PPO | Source: Ambulatory Visit | Attending: Obstetrics & Gynecology | Admitting: Obstetrics & Gynecology

## 2016-01-23 DIAGNOSIS — Z1231 Encounter for screening mammogram for malignant neoplasm of breast: Secondary | ICD-10-CM | POA: Diagnosis present

## 2016-02-05 ENCOUNTER — Other Ambulatory Visit: Payer: Self-pay | Admitting: Primary Care

## 2016-02-05 DIAGNOSIS — R053 Chronic cough: Secondary | ICD-10-CM

## 2016-02-05 DIAGNOSIS — R062 Wheezing: Secondary | ICD-10-CM

## 2016-02-05 DIAGNOSIS — R05 Cough: Secondary | ICD-10-CM

## 2016-08-03 ENCOUNTER — Other Ambulatory Visit: Payer: Self-pay | Admitting: Primary Care

## 2016-08-03 DIAGNOSIS — R053 Chronic cough: Secondary | ICD-10-CM

## 2016-08-03 DIAGNOSIS — R05 Cough: Secondary | ICD-10-CM

## 2016-08-03 DIAGNOSIS — R062 Wheezing: Secondary | ICD-10-CM

## 2016-08-12 ENCOUNTER — Encounter: Payer: Self-pay | Admitting: Primary Care

## 2016-08-12 ENCOUNTER — Ambulatory Visit (INDEPENDENT_AMBULATORY_CARE_PROVIDER_SITE_OTHER): Payer: Federal, State, Local not specified - PPO | Admitting: Primary Care

## 2016-08-12 DIAGNOSIS — R058 Other specified cough: Secondary | ICD-10-CM

## 2016-08-12 DIAGNOSIS — R05 Cough: Secondary | ICD-10-CM

## 2016-08-12 DIAGNOSIS — I10 Essential (primary) hypertension: Secondary | ICD-10-CM

## 2016-08-12 DIAGNOSIS — R0602 Shortness of breath: Secondary | ICD-10-CM | POA: Diagnosis not present

## 2016-08-12 DIAGNOSIS — K219 Gastro-esophageal reflux disease without esophagitis: Secondary | ICD-10-CM

## 2016-08-12 DIAGNOSIS — R062 Wheezing: Secondary | ICD-10-CM | POA: Diagnosis not present

## 2016-08-12 DIAGNOSIS — R059 Cough, unspecified: Secondary | ICD-10-CM

## 2016-08-12 DIAGNOSIS — R053 Chronic cough: Secondary | ICD-10-CM

## 2016-08-12 MED ORDER — ALBUTEROL SULFATE HFA 108 (90 BASE) MCG/ACT IN AERS: 2.0000 | INHALATION_SPRAY | RESPIRATORY_TRACT | 1 refills | Status: DC | PRN

## 2016-08-12 MED ORDER — FLUTICASONE PROPIONATE HFA 44 MCG/ACT IN AERO: 2.0000 | INHALATION_SPRAY | Freq: Two times a day (BID) | RESPIRATORY_TRACT | 5 refills | Status: DC

## 2016-08-12 NOTE — Assessment & Plan Note (Signed)
Infrequent episodes. Not currently taking anything OTC.

## 2016-08-12 NOTE — Progress Notes (Signed)
   Subjective:    Patient ID: Zoe LaddElizabeth A Vaughn, female    DOB: 12/10/1945, 71 y.o.   MRN: [id number]  HPI  Ms. Zoe Vaughn is a 71 year old female who presents today for follow up and medication refills.  1) Asthma/Upper airway cough syndrome: Currently managed on Flovent daily and albuterol PRN. She's using her Flovent once every several weeks as needed for wheezing. She's not using her albuterol inhaler at all.   2) Essential Hypertension: Currently managed on losartan 50 mg. Her BP in the office today is 162/74. She just resumed her losartan three days ago as she stopped taking it six months ago. She denies headaches, chest pain, dizziness. She's not been monitoring her BP at home.  3) GERD: Infrequently. Not currently taking anything OTC or prescription strength.   Review of Systems  Constitutional: Negative for fatigue.  Respiratory: Negative for cough.        Intermittent wheezing with SOB   Cardiovascular: Negative for chest pain.  Allergic/Immunologic: Positive for environmental allergies.  Neurological: Negative for dizziness and headaches.       No past medical history on file.   Social History   Social History  . Marital status: Married    Spouse name: N/A  . Number of children: N/A  . Years of education: N/A   Occupational History  . Not on file.   Social History Main Topics  . Smoking status: Former Smoker    Packs/day: 0.50    Years: 20.00    Types: Cigarettes    Quit date: 04/07/1983  . Smokeless tobacco: Never Used  . Alcohol use 0.0 oz/week     Comment: social  . Drug use: No  . Sexual activity: Yes    Birth control/ protection: Post-menopausal   Other Topics Concern  . Not on file   Social History Narrative  . No narrative on file    Past Surgical History:  Procedure Laterality Date  . BREAST BIOPSY Right 1998   neg  . THUMB ARTHROSCOPY  2010    Family History  Problem Relation Age of Onset  . Emphysema Maternal Grandfather     unsure if  he smoked  . Lung cancer Sister     smoked  . Arthritis Sister   . Arthritis Mother   . Alcohol abuse Father   . Breast cancer Neg Hx     No Known Allergies  Current Outpatient Prescriptions on File Prior to Visit  Medication Sig Dispense Refill  . losartan (COZAAR) 50 MG tablet TAKE 1 TABLET (50 MG TOTAL) BY MOUTH DAILY. 90 tablet 3   No current facility-administered medications on file prior to visit.     BP (!) 162/74   Pulse 63   Temp 98.1 F (36.7 C) (Oral)   Wt 170 lb 12 oz (77.5 kg)   SpO2 97%   BMI 27.98 kg/m    Objective:   Physical Exam  Constitutional: She appears well-nourished. She does not appear ill.  Mild wheezing  Cardiovascular: Normal rate and regular rhythm.   Pulmonary/Chest: Effort normal. She has no decreased breath sounds. She has wheezes in the left upper field. She has no rhonchi. She has no rales.  Skin: Skin is warm and dry.          Assessment & Plan:

## 2016-08-12 NOTE — Patient Instructions (Signed)
The Flovent 44 mcg inhaler is to be used daily for asthma symptoms.  The albuterol is to be used as needed for shortness of breath, wheezing, coughing spells. Inhale 2 puffs into the lungs every 4 as needed for wheezing and/or shortness of breath.   Try using the albuterol if you don't have daily symptoms. If you start using the albuterol inhaler more than three times weekly then please call me.  Check your blood pressure daily, around the same time of day, for the next 2 weeks.  Ensure that you have rested for 30 minutes prior to checking your blood pressure. Record your readings as I will call you for those readings.  Continue Losartan 50 mg tablets for now.  It was a pleasure to see you today!

## 2016-08-12 NOTE — Progress Notes (Signed)
Pre visit review using our clinic review tool, if applicable. No additional management support is needed unless otherwise documented below in the visit note. 

## 2016-08-12 NOTE — Assessment & Plan Note (Signed)
Uncontrolled, recently started Losartan three days ago. Will have her continue to take Losartan, she will check BP over the next 2 weeks, we will call for readings at that time.  If BP above goal, then will increase Losartan. She will need BMP in 2-3 weeks.

## 2016-08-12 NOTE — Assessment & Plan Note (Signed)
Discussed that Flovent is to be used daily, not PRN. Will have her use the albuterol PRN as her symptoms are infrequent. She will update if she requires use more than three times weekly. Refills sent for both.

## 2016-08-21 ENCOUNTER — Other Ambulatory Visit: Payer: Self-pay | Admitting: Primary Care

## 2016-08-21 DIAGNOSIS — I10 Essential (primary) hypertension: Secondary | ICD-10-CM

## 2016-12-23 ENCOUNTER — Ambulatory Visit (INDEPENDENT_AMBULATORY_CARE_PROVIDER_SITE_OTHER): Payer: Federal, State, Local not specified - PPO | Admitting: Obstetrics & Gynecology

## 2016-12-23 ENCOUNTER — Encounter: Payer: Self-pay | Admitting: Obstetrics & Gynecology

## 2016-12-23 VITALS — BP 166/81 | HR 82 | Ht 64.5 in | Wt 165.0 lb

## 2016-12-23 DIAGNOSIS — R29890 Loss of height: Secondary | ICD-10-CM

## 2016-12-23 DIAGNOSIS — Z23 Encounter for immunization: Secondary | ICD-10-CM | POA: Diagnosis not present

## 2016-12-23 DIAGNOSIS — Z01419 Encounter for gynecological examination (general) (routine) without abnormal findings: Secondary | ICD-10-CM | POA: Diagnosis not present

## 2016-12-23 NOTE — Progress Notes (Signed)
Subjective:    Zoe Vaughn is a 71 y.o. MW P3 female who presents for an annual exam. The patient has no complaints today. The patient is not currently sexually active. GYN screening history: last pap: was normal. The patient wears seatbelts: yes. The patient participates in regular exercise: yes. Has the patient ever been transfused or tattooed?: no. The patient reports that there is not domestic violence in her life.   Menstrual History: OB History    Gravida Para Term Preterm AB Living   SAB TAB Ectopic Multiple Live Births           3      Menarche age: 49 No LMP recorded. Patient is postmenopausal.    The following portions of the patient's history were reviewed and updated as appropriate: allergies, current medications, past family history, past medical history, past social history, past surgical history and problem list.  Review of Systems Pertinent items are noted in HPI.   FH- no breast/gyn/colon cancer Married for 50 years Husband starting with dementia Works Systems developer at [employer]   Objective:    BP (!) 166/81   Pulse 82   Ht 5' 4.5" (1.638 m)   Wt 165 lb (74.8 kg)   BMI 27.88 kg/m   General Appearance:    Alert, cooperative, no distress, appears stated age  Head:    Normocephalic, without obvious abnormality, atraumatic  Eyes:    PERRL, conjunctiva/corneas clear, EOM's intact, fundi    benign, both eyes  Ears:    Normal TM's and external ear canals, both ears  Nose:   Nares normal, septum midline, mucosa normal, no drainage    or sinus tenderness  Throat:   Lips, mucosa, and tongue normal; teeth and gums normal  Neck:   Supple, symmetrical, trachea midline, no adenopathy;    thyroid:  no enlargement/tenderness/nodules; no carotid   bruit or JVD  Back:     Symmetric, no curvature, ROM normal, no CVA tenderness  Lungs:     Clear to auscultation bilaterally, respirations unlabored  Chest Wall:    No tenderness or deformity   Heart:     Regular rate and rhythm, S1 and S2 normal, no murmur, rub   or gallop  Breast Exam:    No tenderness, masses, or nipple abnormality  Abdomen:     Soft, non-tender, bowel sounds active all four quadrants,    no masses, no organomegaly  Genitalia:    Normal female without lesion, discharge or tenderness, atrophy, NSSA, NT, normal adnexal exam     Extremities:   Extremities normal, atraumatic, no cyanosis or edema  Pulses:   2+ and symmetric all extremities  Skin:   Skin color, texture, turgor normal, no rashes or lesions  Lymph nodes:   Cervical, supraclavicular, and axillary nodes normal  Neurologic:   CNII-XII intact, normal strength, sensation and reflexes    throughout  .    Assessment:    Healthy female exam.   Height loss    Plan:     offered referral to psychologist   Mammogram DEXA Flu vaccine given today

## 2017-02-01 ENCOUNTER — Ambulatory Visit
Admission: RE | Admit: 2017-02-01 | Discharge: 2017-02-01 | Disposition: A | Discharge status: Home / Self Care | Payer: Federal, State, Local not specified - PPO | Source: Ambulatory Visit | Attending: Obstetrics & Gynecology | Admitting: Obstetrics & Gynecology

## 2017-02-01 DIAGNOSIS — R29890 Loss of height: Secondary | ICD-10-CM | POA: Insufficient documentation

## 2017-02-01 DIAGNOSIS — Z8262 Family history of osteoporosis: Secondary | ICD-10-CM | POA: Insufficient documentation

## 2017-02-01 DIAGNOSIS — M25559 Pain in unspecified hip: Secondary | ICD-10-CM | POA: Diagnosis not present

## 2017-02-01 DIAGNOSIS — Z780 Asymptomatic menopausal state: Secondary | ICD-10-CM | POA: Diagnosis not present

## 2017-02-01 DIAGNOSIS — Z01419 Encounter for gynecological examination (general) (routine) without abnormal findings: Secondary | ICD-10-CM | POA: Insufficient documentation

## 2017-02-01 DIAGNOSIS — Z1231 Encounter for screening mammogram for malignant neoplasm of breast: Secondary | ICD-10-CM | POA: Diagnosis present

## 2017-03-16 ENCOUNTER — Other Ambulatory Visit: Payer: Self-pay

## 2017-03-16 ENCOUNTER — Encounter
Admission: RE | Admit: 2017-03-16 | Discharge: 2017-03-16 | Disposition: A | Discharge status: Home / Self Care | Payer: Federal, State, Local not specified - PPO | Source: Ambulatory Visit | Attending: Otolaryngology | Admitting: Otolaryngology

## 2017-03-16 DIAGNOSIS — Z01818 Encounter for other preprocedural examination: Secondary | ICD-10-CM | POA: Diagnosis present

## 2017-03-16 DIAGNOSIS — I10 Essential (primary) hypertension: Secondary | ICD-10-CM | POA: Insufficient documentation

## 2017-03-16 DIAGNOSIS — Z01812 Encounter for preprocedural laboratory examination: Secondary | ICD-10-CM | POA: Diagnosis not present

## 2017-03-16 LAB — CBC
HEMATOCRIT: 40.4 % (ref 35.0–47.0)
Hemoglobin: 13.7 g/dL (ref 12.0–16.0)
MCH: 31 pg (ref 26.0–34.0)
MCHC: 33.9 g/dL (ref 32.0–36.0)
MCV: 91.3 fL (ref 80.0–100.0)
Platelets: 237 10*3/uL (ref 150–440)
RBC: 4.43 MIL/uL (ref 3.80–5.20)
RDW: 13.1 % (ref 11.5–14.5)
WBC: 5.7 10*3/uL (ref 3.6–11.0)

## 2017-03-16 LAB — BASIC METABOLIC PANEL
Anion gap: 10 (ref 5–15)
BUN: 17 mg/dL (ref 6–20)
CHLORIDE: 104 mmol/L (ref 101–111)
CO2: 24 mmol/L (ref 22–32)
Calcium: 9.8 mg/dL (ref 8.9–10.3)
Creatinine, Ser: 0.62 mg/dL (ref 0.44–1.00)
GFR calc Af Amer: >60 mL/min (ref >60)
GFR calc non Af Amer: >60 mL/min (ref >60)
Glucose, Bld: 82 mg/dL (ref 65–99)
POTASSIUM: 4.2 mmol/L (ref 3.5–5.1)
SODIUM: 138 mmol/L (ref 135–145)

## 2017-03-16 NOTE — Patient Instructions (Signed)
Your procedure is scheduled on: Wed. 03/31/17 Report to Day Surgery. To find out your arrival time please call [phone] between 1PM - 3PM on Monday.03/29/17  Remember: Instructions that are not followed completely may result in serious medical risk, up to and including death, or upon the discretion of your surgeon and anesthesiologist your surgery may need to be rescheduled.     _X__ 1. Do not eat food after midnight the night before your procedure.                 No gum chewing or hard candies. You may drink clear liquids up to 2 hours                 before you are scheduled to arrive for your surgery- DO not drink clear                 liquids within 2 hours of the start of your surgery.                 Clear Liquids include:  water, apple juice without pulp, clear carbohydrate                 drink such as Clearfast of Gartorade, Black Coffee or Tea (Do not add                 anything to coffee or tea).     _X__ 2.  No Alcohol for 24 hours before or after surgery.   ___ 3.  Do Not Smoke or use e-cigarettes For 24 Hours Prior to Your Surgery.                 Do not use any chewable tobacco products for at least 6 hours prior to                 surgery.  ____  4.  Bring all medications with you on the day of surgery if instructed.   __x__  5.  Notify your doctor if there is any change in your medical condition      (cold, fever, infections).     Do not wear jewelry, make-up, hairpins, clips or nail polish. Do not wear lotions, powders, or perfumes. You may wear deodorant. Do not shave 48 hours prior to surgery. Men may shave face and neck. Do not bring valuables to the hospital.    [facility] not [facility] for any belongings or valuables.  Contacts, dentures or bridgework may not be worn into surgery. Leave your suitcase in the car. After surgery it may be brought to your room. For patients admitted to the hospital, discharge time is determined  by your treatment team.   Patients discharged the day of surgery will not be allowed to drive home.   Please read over the following fact sheets that you were given:    ____ Take these medicines the morning of surgery with A SIP OF WATER:    1.   2.   3.   4.  5.  6.  ____ Fleet Enema (as directed)   ____ Use CHG Soap as directed  __x__ Use inhalers on the day of surgeryfluticasone (FLOVENT HFA) 44 MCG/ACT inhaler, albuterol (PROAIR HFA) 108 (90 Base) MCG/ACT inhaler and bring to hospital  ____ Stop metformin 2 days prior to surgery    ____ Take 1/2 of usual insulin dose the night before surgery. No insulin the morning  of surgery.   ____ Stop Coumadin/Plavix/aspirin on   _x___ Stop Anti-inflammatories on 12/19 ibuprofen or aleve   __x__ Stop supplements until after surgery.  12/19  ____ Bring C-Pap to the hospital.

## 2017-03-31 ENCOUNTER — Encounter
Admission: RE | Disposition: A | Discharge status: Home / Self Care | Payer: Self-pay | Source: Ambulatory Visit | Attending: Otolaryngology

## 2017-03-31 ENCOUNTER — Ambulatory Visit
Admission: RE | Admit: 2017-03-31 | Discharge: 2017-03-31 | Disposition: A | Discharge status: Home / Self Care | Payer: Federal, State, Local not specified - PPO | Source: Ambulatory Visit | Attending: Otolaryngology | Admitting: Otolaryngology

## 2017-03-31 ENCOUNTER — Encounter: Payer: Self-pay | Admitting: Anesthesiology

## 2017-03-31 ENCOUNTER — Ambulatory Visit: Payer: Federal, State, Local not specified - PPO | Admitting: Anesthesiology

## 2017-03-31 DIAGNOSIS — Z87891 Personal history of nicotine dependence: Secondary | ICD-10-CM | POA: Diagnosis not present

## 2017-03-31 DIAGNOSIS — I10 Essential (primary) hypertension: Secondary | ICD-10-CM | POA: Insufficient documentation

## 2017-03-31 DIAGNOSIS — J339 Nasal polyp, unspecified: Secondary | ICD-10-CM | POA: Diagnosis not present

## 2017-03-31 DIAGNOSIS — Z79899 Other long term (current) drug therapy: Secondary | ICD-10-CM | POA: Insufficient documentation

## 2017-03-31 DIAGNOSIS — J329 Chronic sinusitis, unspecified: Secondary | ICD-10-CM | POA: Insufficient documentation

## 2017-03-31 HISTORY — PX: ETHMOIDECTOMY: SHX5197

## 2017-03-31 HISTORY — PX: FRONTAL SINUS EXPLORATION: SHX6591

## 2017-03-31 HISTORY — PX: IMAGE GUIDED SINUS SURGERY: SHX6570

## 2017-03-31 HISTORY — PX: SPHENOIDECTOMY: SHX2421

## 2017-03-31 HISTORY — PX: MAXILLARY ANTROSTOMY: SHX2003

## 2017-03-31 SURGERY — SINUS SURGERY, WITH IMAGING GUIDANCE
Anesthesia: General

## 2017-03-31 MED ORDER — AMOXICILLIN-POT CLAVULANATE 875-125 MG PO TABS: 1.0000 | ORAL_TABLET | Freq: Two times a day (BID) | ORAL | 0 refills | Status: DC

## 2017-03-31 MED ORDER — ROCURONIUM BROMIDE 50 MG/5ML IV SOLN
INTRAVENOUS | Status: AC
  Filled 2017-03-31: qty 1

## 2017-03-31 MED ORDER — SUGAMMADEX SODIUM 200 MG/2ML IV SOLN
INTRAVENOUS | Status: AC
  Filled 2017-03-31: qty 2

## 2017-03-31 MED ORDER — ONDANSETRON HCL 4 MG/2ML IJ SOLN
INTRAMUSCULAR | Status: DC | PRN
  Administered 2017-03-31: 4 mg via INTRAVENOUS

## 2017-03-31 MED ORDER — DEXAMETHASONE SODIUM PHOSPHATE 10 MG/ML IJ SOLN
INTRAMUSCULAR | Status: DC | PRN
  Administered 2017-03-31: 8 mg via INTRAVENOUS

## 2017-03-31 MED ORDER — EPHEDRINE SULFATE 50 MG/ML IJ SOLN
INTRAMUSCULAR | Status: DC | PRN
Start: 2017-03-31 — End: 2017-03-31
  Administered 2017-03-31 (×2): 5 mg via INTRAVENOUS

## 2017-03-31 MED ORDER — LIDOCAINE-EPINEPHRINE (PF) 1 %-1:200000 IJ SOLN
INTRAMUSCULAR | Status: DC | PRN
  Administered 2017-03-31: 8 mL

## 2017-03-31 MED ORDER — FENTANYL CITRATE (PF) 100 MCG/2ML IJ SOLN
INTRAMUSCULAR | Status: AC
  Filled 2017-03-31: qty 2

## 2017-03-31 MED ORDER — HYDROCODONE-ACETAMINOPHEN 5-325 MG PO TABS: 1.0000 | ORAL_TABLET | ORAL | 0 refills | Status: DC | PRN

## 2017-03-31 MED ORDER — SUCCINYLCHOLINE CHLORIDE 20 MG/ML IJ SOLN
INTRAMUSCULAR | Status: AC
  Filled 2017-03-31: qty 1

## 2017-03-31 MED ORDER — FENTANYL CITRATE (PF) 100 MCG/2ML IJ SOLN
INTRAMUSCULAR | Status: DC | PRN
  Administered 2017-03-31 (×2): 50 ug via INTRAVENOUS
  Administered 2017-03-31: 100 ug via INTRAVENOUS
  Administered 2017-03-31: 50 ug via INTRAVENOUS

## 2017-03-31 MED ORDER — FAMOTIDINE 20 MG PO TABS
20.0000 mg | ORAL_TABLET | Freq: Once | ORAL | Status: AC
  Administered 2017-03-31: 20 mg via ORAL

## 2017-03-31 MED ORDER — SUGAMMADEX SODIUM 200 MG/2ML IV SOLN
INTRAVENOUS | Status: DC | PRN
  Administered 2017-03-31: 150 mg via INTRAVENOUS

## 2017-03-31 MED ORDER — ONDANSETRON HCL 4 MG/2ML IJ SOLN
INTRAMUSCULAR | Status: AC
  Filled 2017-03-31: qty 2

## 2017-03-31 MED ORDER — FENTANYL CITRATE (PF) 100 MCG/2ML IJ SOLN: 25.0000 ug | INTRAMUSCULAR | Status: DC | PRN

## 2017-03-31 MED ORDER — DEXAMETHASONE SODIUM PHOSPHATE 10 MG/ML IJ SOLN
INTRAMUSCULAR | Status: AC
  Filled 2017-03-31: qty 1

## 2017-03-31 MED ORDER — LACTATED RINGERS IV SOLN
INTRAVENOUS | Status: DC
  Administered 2017-03-31: 09:00:00 via INTRAVENOUS

## 2017-03-31 MED ORDER — LIDOCAINE HCL (CARDIAC) 20 MG/ML IV SOLN
INTRAVENOUS | Status: DC | PRN
  Administered 2017-03-31: 50 mg via INTRAVENOUS

## 2017-03-31 MED ORDER — SODIUM CHLORIDE 0.9 % IJ SOLN
INTRAMUSCULAR | Status: AC
  Filled 2017-03-31: qty 10

## 2017-03-31 MED ORDER — PHENYLEPHRINE HCL 10 MG/ML IJ SOLN
INTRAMUSCULAR | Status: DC | PRN
  Administered 2017-03-31: 100 ug via INTRAVENOUS
  Administered 2017-03-31: 200 ug via INTRAVENOUS
  Administered 2017-03-31: 100 ug via INTRAVENOUS

## 2017-03-31 MED ORDER — ESMOLOL HCL 100 MG/10ML IV SOLN
INTRAVENOUS | Status: AC
  Filled 2017-03-31: qty 10

## 2017-03-31 MED ORDER — OXYMETAZOLINE HCL 0.05 % NA SOLN
NASAL | Status: AC
  Filled 2017-03-31: qty 15

## 2017-03-31 MED ORDER — ESMOLOL HCL 100 MG/10ML IV SOLN
INTRAVENOUS | Status: DC | PRN
  Administered 2017-03-31: 30 mg via INTRAVENOUS

## 2017-03-31 MED ORDER — PROPOFOL 10 MG/ML IV BOLUS
INTRAVENOUS | Status: AC
  Filled 2017-03-31: qty 20

## 2017-03-31 MED ORDER — ONDANSETRON HCL 4 MG/2ML IJ SOLN: 4.0000 mg | Freq: Once | INTRAMUSCULAR | Status: DC | PRN

## 2017-03-31 MED ORDER — LIDOCAINE-EPINEPHRINE (PF) 1 %-1:200000 IJ SOLN
INTRAMUSCULAR | Status: AC
  Filled 2017-03-31: qty 30

## 2017-03-31 MED ORDER — FAMOTIDINE 20 MG PO TABS
ORAL_TABLET | ORAL | Status: AC
  Filled 2017-03-31: qty 1

## 2017-03-31 MED ORDER — ROCURONIUM BROMIDE 100 MG/10ML IV SOLN
INTRAVENOUS | Status: DC | PRN
  Administered 2017-03-31: 45 mg via INTRAVENOUS
  Administered 2017-03-31 (×2): 10 mg via INTRAVENOUS
  Administered 2017-03-31: 5 mg via INTRAVENOUS
  Administered 2017-03-31: 10 mg via INTRAVENOUS

## 2017-03-31 MED ORDER — SUCCINYLCHOLINE CHLORIDE 20 MG/ML IJ SOLN
INTRAMUSCULAR | Status: DC | PRN
  Administered 2017-03-31: 90 mg via INTRAVENOUS

## 2017-03-31 MED ORDER — LIDOCAINE HCL 4 % MT SOLN
OROMUCOSAL | Status: DC | PRN
  Administered 2017-03-31: 3 mL via TOPICAL

## 2017-03-31 MED ORDER — ROCURONIUM BROMIDE 50 MG/5ML IV SOLN
INTRAVENOUS | Status: AC
Start: 2017-03-31 — End: ?
  Filled 2017-03-31: qty 1

## 2017-03-31 MED ORDER — PROPOFOL 10 MG/ML IV BOLUS
INTRAVENOUS | Status: DC | PRN
  Administered 2017-03-31: 150 mg via INTRAVENOUS

## 2017-03-31 MED ORDER — LIDOCAINE HCL (PF) 2 % IJ SOLN
INTRAMUSCULAR | Status: AC
  Filled 2017-03-31: qty 10

## 2017-03-31 MED ORDER — EPHEDRINE SULFATE 50 MG/ML IJ SOLN
INTRAMUSCULAR | Status: AC
  Filled 2017-03-31: qty 1

## 2017-03-31 SURGICAL SUPPLY — 26 items
BANDAGE EYE OVAL (MISCELLANEOUS) IMPLANT
BATTERY INSTRU NAVIGATION (MISCELLANEOUS) ×8 IMPLANT
CANISTER SUCT 1200ML W/VALVE (MISCELLANEOUS) ×4 IMPLANT
CANISTER SUCT 3000ML PPV (MISCELLANEOUS) ×4 IMPLANT
CUP MEDICINE 2OZ PLAST GRAD ST (MISCELLANEOUS) IMPLANT
DRESSING NASL FOAM PST OP SINU (MISCELLANEOUS) ×4 IMPLANT
DRSG NASAL FOAM POST OP SINU (MISCELLANEOUS) ×8
GLOVE BIO SURGEON STRL SZ7.5 (GLOVE) ×4 IMPLANT
GOWN STRL REUS W/ TWL LRG LVL3 (GOWN DISPOSABLE) ×4 IMPLANT
GOWN STRL REUS W/TWL LRG LVL3 (GOWN DISPOSABLE) ×4
IV LACTATED RINGERS 1000ML (IV SOLUTION) ×4 IMPLANT
KIT RM TURNOVER STRD PROC AR (KITS) ×4 IMPLANT
NEEDLE SPNL 25GX3.5 QUINCKE BL (NEEDLE) ×4 IMPLANT
NS IRRIG 500ML POUR BTL (IV SOLUTION) ×4 IMPLANT
PACK HEAD/NECK (MISCELLANEOUS) ×4 IMPLANT
SHAVER DIEGO BLD STD TYPE A (BLADE) ×4 IMPLANT
SOL ANTI-FOG 6CC FOG-OUT (MISCELLANEOUS) ×2 IMPLANT
SOL FOG-OUT ANTI-FOG 6CC (MISCELLANEOUS) ×2
SPONGE NEURO XRAY DETECT 1X3 (DISPOSABLE) ×4 IMPLANT
SUT ETHILON 3-0 FS-10 30 BLK (SUTURE) ×4
SUTURE EHLN 3-0 FS-10 30 BLK (SUTURE) ×2 IMPLANT
SWAB CULTURE AMIES ANAERIB BLU (MISCELLANEOUS) ×4 IMPLANT
TRACKER CRANIALMASK (MASK) ×4 IMPLANT
TRAP SPECIMEN MUCOUS 40CC (MISCELLANEOUS) IMPLANT
TUBING DECLOG MULTIDEBRIDER (TUBING) ×4 IMPLANT
WATER STERILE IRR 1000ML POUR (IV SOLUTION) ×4 IMPLANT

## 2017-03-31 NOTE — Op Note (Signed)
03/31/2017  1:02 PM    Zoe Vaughn  [id number]   Pre-Op Diagnosis:  chronic sinusitis  Post-op Diagnosis: chronic sinusitis, nasal polyps Procedure:  1)  Image Guided Sinus Surgery,   2)  Bilateral Endoscopic Maxillary Antrostomy with Tissue Removal   3)  Bilateral Frontal Sinusotomy   4)  Bilateral Total Ethmoidectomy   5)  Bilateral Sphenoidotomy    Surgeon:  Sandi MealyBennett, Shaft Corigliano S  Anesthesia:  General endotracheal  EBL:  200cc   Complications:  None  Findings: Inflamed sinuses with polyps in ethmoids and frontal recess. Purulent secretions from left maxillary sinus cultured  Procedure: After the patient was identified in holding and the benefits of the procedure were reviewed as well as the consent and risks, the patient was taken to the operating room and with the patient in a comfortable supine position,  general orotracheal anesthesia was induced without difficulty.  A proper time-out was performed.  The [employer] image guidance system was set up and calibrated in the normal fashion and felt to be acceptable.  Next 1% Xylocaine with 1:200,000 epinephrine was infiltrated into the inferior turbinates, septum, and anterior middle turbinates bilaterally.  Several minutes were allowed for this to take effect.  Cottoniod pledgets soaked in Afrin were placed into both nasal cavities and left while the patient was prepped and draped in the standard fashion. The image guided suction was calibrated and used to inspect known points in the nasal cavity to assess accuracy of the image guided system. Accuracy was felt to be excellent.   The left middle turbinate was medialized and the uncinate process then resected with through-cutting forceps as well as the microdebrider. In this fashion the uncinate was completely removed along with soft tissue and bone of the medial wall of the maxillary sinus to create a large patent maxillary antrostomy. The left maxillary sinus was suctioned to clear  purulent secretions. A culture was taken.  Next the left anterior ethmoid sinuses were dissected beginning inferomedially, entering the ethmoid bulla. Thru cut forceps were used to open the anterior ethmoids. The microdebrider was used as needed to trim loose mucosal edges.  Next the basal lamella was entered and, working back in a sequential fashion through the ethmoid air cels, the ethmoid sinuses were dissected to the posterior ethmoid sinuses, utilizing the image guided suction and the whole time to reassess the anatomy frequently. Thru cutting forceps were used for this dissection. Care was taken to avoid injury to the lamina papyracea laterally and the skull base superiorly.   Dissection proceeded anteriorly and superiorly into the left frontal recess which was dissected utilizing a 30  scope and frontal curved instruments. The curved image guided suction was used during this dissection to frequently reassess the anatomy on the CT scan. The frontal recess was dissected until a suction could be passed up into the region of the frontal sinus.   Next the scope was passed medial to the middle turbinate and the left sphenoid recess inspected. With the assistance of the image guided system, the sphenoid sinus was carefully entered through some thin bone at the anterior face of the spenoid, medial to the superior turbinate, and opened, debriding some polypoid tissue in the sphenoid recess.  Attention was then turned to the right side where the same procedure was performed, opening the maxillary sinus, ethmoid cavities, sphenoid sinus and the frontal recess in the same fashion as described above.   The nose was suctioned and inspected. The maxillary sinuses were irrigated  with saline. Stammberger absorbable sinus packing was then placed in the ethmoid cavities bilaterally.   The patient was then returned to the anesthesiologist for awakening and taken to recovery room in good condition  postoperatively.  Disposition:   PACU and d/c home  Plan: Ice, elevation, narcotic analgesia and prophylactic antibiotics. Begin sinus irrigations with saline tomrrow, irrigating 3-4 times daily. Return to the office in 7 days.  Return to work in 7-10 days, no strenuous activities for two weeks.   Sandi MealyBennett, Dimitris Shanahan S 03/31/2017 1:02 PM

## 2017-03-31 NOTE — Discharge Instructions (Signed)
AMBULATORY SURGERY  °DISCHARGE INSTRUCTIONS ° ° °1) The drugs that you were given will stay in your system until tomorrow so for the next 24 hours you should not: ° °A) Drive an automobile °B) Make any legal decisions °C) Drink any alcoholic beverage ° ° °2) You may resume regular meals tomorrow.  Today it is better to start with liquids and gradually work up to solid foods. ° °You may eat anything you prefer, but it is better to start with liquids, then soup and crackers, and gradually work up to solid foods. ° ° °3) Please notify your doctor immediately if you have any unusual bleeding, trouble breathing, redness and pain at the surgery site, drainage, fever, or pain not relieved by medication. ° ° ° °4) Additional Instructions: ° ° ° ° ° ° ° °Please contact your physician with any problems or Same Day Surgery at [phone], Monday through Friday 6 am to 4 pm, or [place] [facility] [place] Main number at [phone]. °

## 2017-03-31 NOTE — Anesthesia Preprocedure Evaluation (Addendum)
Anesthesia Evaluation  Patient identified by MRN, date of birth, ID band Patient awake    Reviewed: Allergy & Precautions, NPO status , Patient's Chart, lab work & pertinent test results  Airway Mallampati: III  TM Distance: <3 FB     Dental  (+) Chipped   Pulmonary asthma , former smoker,    Pulmonary exam normal        Cardiovascular hypertension, Pt. on medications Normal cardiovascular exam     Neuro/Psych negative neurological ROS  negative psych ROS   GI/Hepatic Neg liver ROS, GERD  ,  Endo/Other  negative endocrine ROS  Renal/GU negative Renal ROS  negative genitourinary   Musculoskeletal negative musculoskeletal ROS (+)   Abdominal Normal abdominal exam  (+)   Peds negative pediatric ROS (+)  Hematology negative hematology ROS (+)   Anesthesia Other Findings   Reproductive/Obstetrics                            Anesthesia Physical Anesthesia Plan  ASA: III  Anesthesia Plan: General   Post-op Pain Management:    Induction: Intravenous  PONV Risk Score and Plan:   Airway Management Planned: Oral ETT  Additional Equipment:   Intra-op Plan:   Post-operative Plan: Extubation in OR  Informed Consent: I have reviewed the patients History and Physical, chart, labs and discussed the procedure including the risks, benefits and alternatives for the proposed anesthesia with the patient or authorized representative who has indicated his/her understanding and acceptance.   Dental advisory given  Plan Discussed with: CRNA and Surgeon  Anesthesia Plan Comments:         Anesthesia Quick Evaluation

## 2017-03-31 NOTE — Anesthesia Procedure Notes (Addendum)
Procedure Name: Intubation Performed by: Lance Muss, CRNA Pre-anesthesia Checklist: Patient identified, Patient being monitored, Timeout performed, Emergency Drugs available and Suction available Patient Re-evaluated:Patient Re-evaluated prior to induction Oxygen Delivery Method: Circle system utilized Preoxygenation: Pre-oxygenation with 100% oxygen Induction Type: IV induction Ventilation: Mask ventilation without difficulty Laryngoscope Size: Mac and 3 Grade View: Grade II Tube type: Oral Rae Tube size: 7.0 mm Number of attempts: 1 Airway Equipment and Method: Stylet and LTA kit utilized Placement Confirmation: ETT inserted through vocal cords under direct vision,  positive ETCO2 and breath sounds checked- equal and bilateral Secured at: 21 cm Tube secured with: Tape Dental Injury: Teeth and Oropharynx as per pre-operative assessment

## 2017-03-31 NOTE — Anesthesia Post-op Follow-up Note (Signed)
Anesthesia QCDR form completed.        

## 2017-03-31 NOTE — Anesthesia Postprocedure Evaluation (Signed)
Anesthesia Post Note  Patient: Zoe LaddElizabeth A Vaughn  Procedure(s) Performed: IMAGE GUIDED SINUS SURGERY (N/A ) MAXILLARY ANTROSTOMY (Bilateral ) ETHMOIDECTOMY (Bilateral ) FRONTAL SINUS EXPLORATION (Bilateral ) SPHENOIDECTOMY (Bilateral )  Patient location during evaluation: PACU Anesthesia Type: General Level of consciousness: oriented and awake and alert Pain management: pain level controlled Vital Signs Assessment: post-procedure vital signs reviewed and stable Respiratory status: spontaneous breathing Cardiovascular status: blood pressure returned to baseline Anesthetic complications: no     Last Vitals:  Vitals:   03/31/17 1326 03/31/17 1327  BP: (!) 186/88 (!) 186/88  Pulse: 75 74  Resp: 14 12  Temp:  36.5 C  SpO2: 100% 99%    Last Pain:  Vitals:   03/31/17 0817  TempSrc: Tympanic                 [name]

## 2017-03-31 NOTE — Transfer of Care (Signed)
Immediate Anesthesia Transfer of Care Note  Patient: Zoe Vaughn  Procedure(s) Performed: IMAGE GUIDED SINUS SURGERY (N/A ) MAXILLARY ANTROSTOMY (Bilateral ) ETHMOIDECTOMY (Bilateral ) FRONTAL SINUS EXPLORATION (Bilateral ) SPHENOIDECTOMY (Bilateral )  Patient Location: PACU  Anesthesia Type:General  Level of Consciousness: sedated and responds to stimulation  Airway & Oxygen Therapy: Patient Spontanous Breathing and Patient connected to face mask oxygen  Post-op Assessment: Report given to RN and Post -op Vital signs reviewed and stable  Post vital signs: Reviewed and stable  Last Vitals:  Vitals:   03/31/17 1326 03/31/17 1327  BP: (!) 186/88 (!) 186/88  Pulse: 75 74  Resp: 14 12  Temp:  36.5 C  SpO2: 100% 99%    Last Pain:  Vitals:   03/31/17 0817  TempSrc: Tympanic         Complications: No apparent anesthesia complications

## 2017-04-01 ENCOUNTER — Encounter: Payer: Self-pay | Admitting: Otolaryngology

## 2017-04-01 LAB — SURGICAL PATHOLOGY

## 2017-04-08 LAB — AEROBIC/ANAEROBIC CULTURE (SURGICAL/DEEP WOUND)

## 2017-04-08 LAB — AEROBIC/ANAEROBIC CULTURE W GRAM STAIN (SURGICAL/DEEP WOUND)

## 2017-05-28 ENCOUNTER — Other Ambulatory Visit: Payer: Self-pay | Admitting: Primary Care

## 2017-05-28 DIAGNOSIS — R059 Cough, unspecified: Secondary | ICD-10-CM

## 2017-05-28 DIAGNOSIS — R05 Cough: Secondary | ICD-10-CM

## 2017-05-28 DIAGNOSIS — R0602 Shortness of breath: Secondary | ICD-10-CM

## 2017-06-05 IMAGING — CR DG CHEST 2V
2 series · 2 of 2 positions shown · non-contrast
Comparison: PA and lateral chest of December 31, 2014

CLINICAL DATA: Chronic cough, remote history of tobacco use,
history of asthma -COPD.

EXAM:
CHEST  2 VIEW

[view not recorded (1 of 2)]
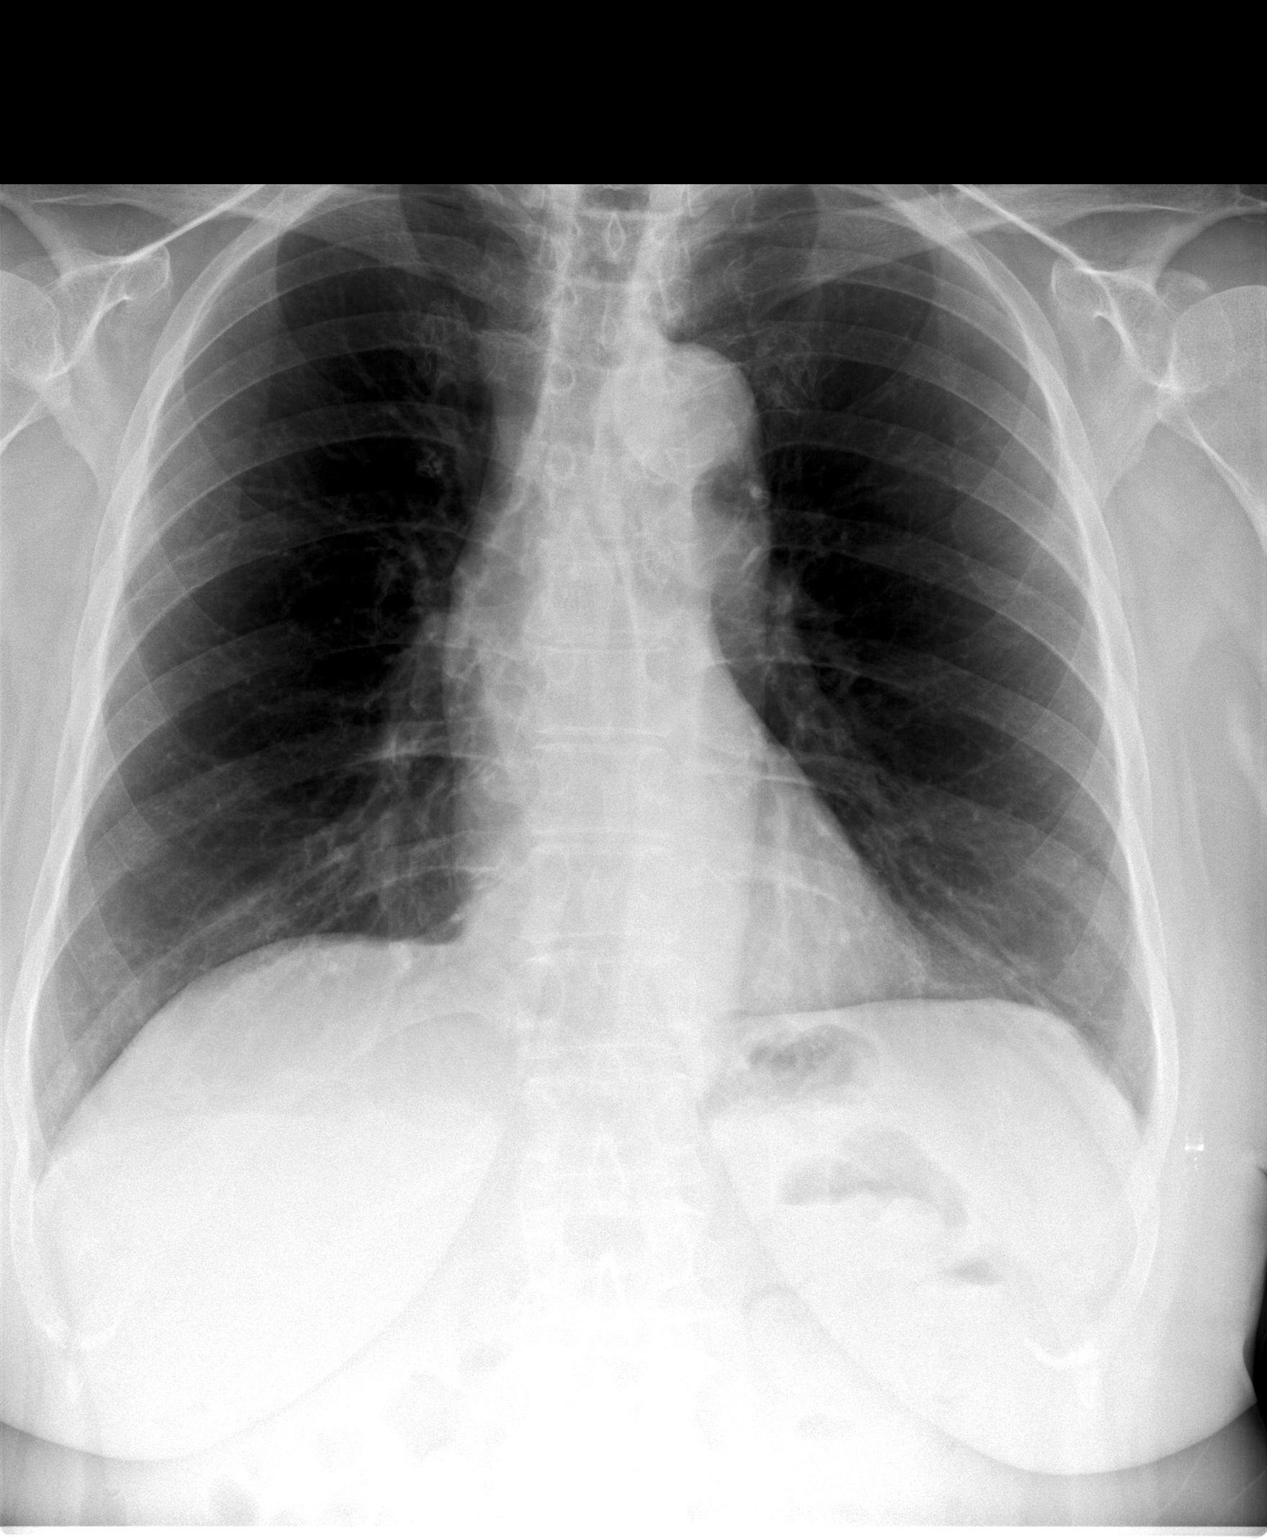

[view not recorded (2 of 2)]
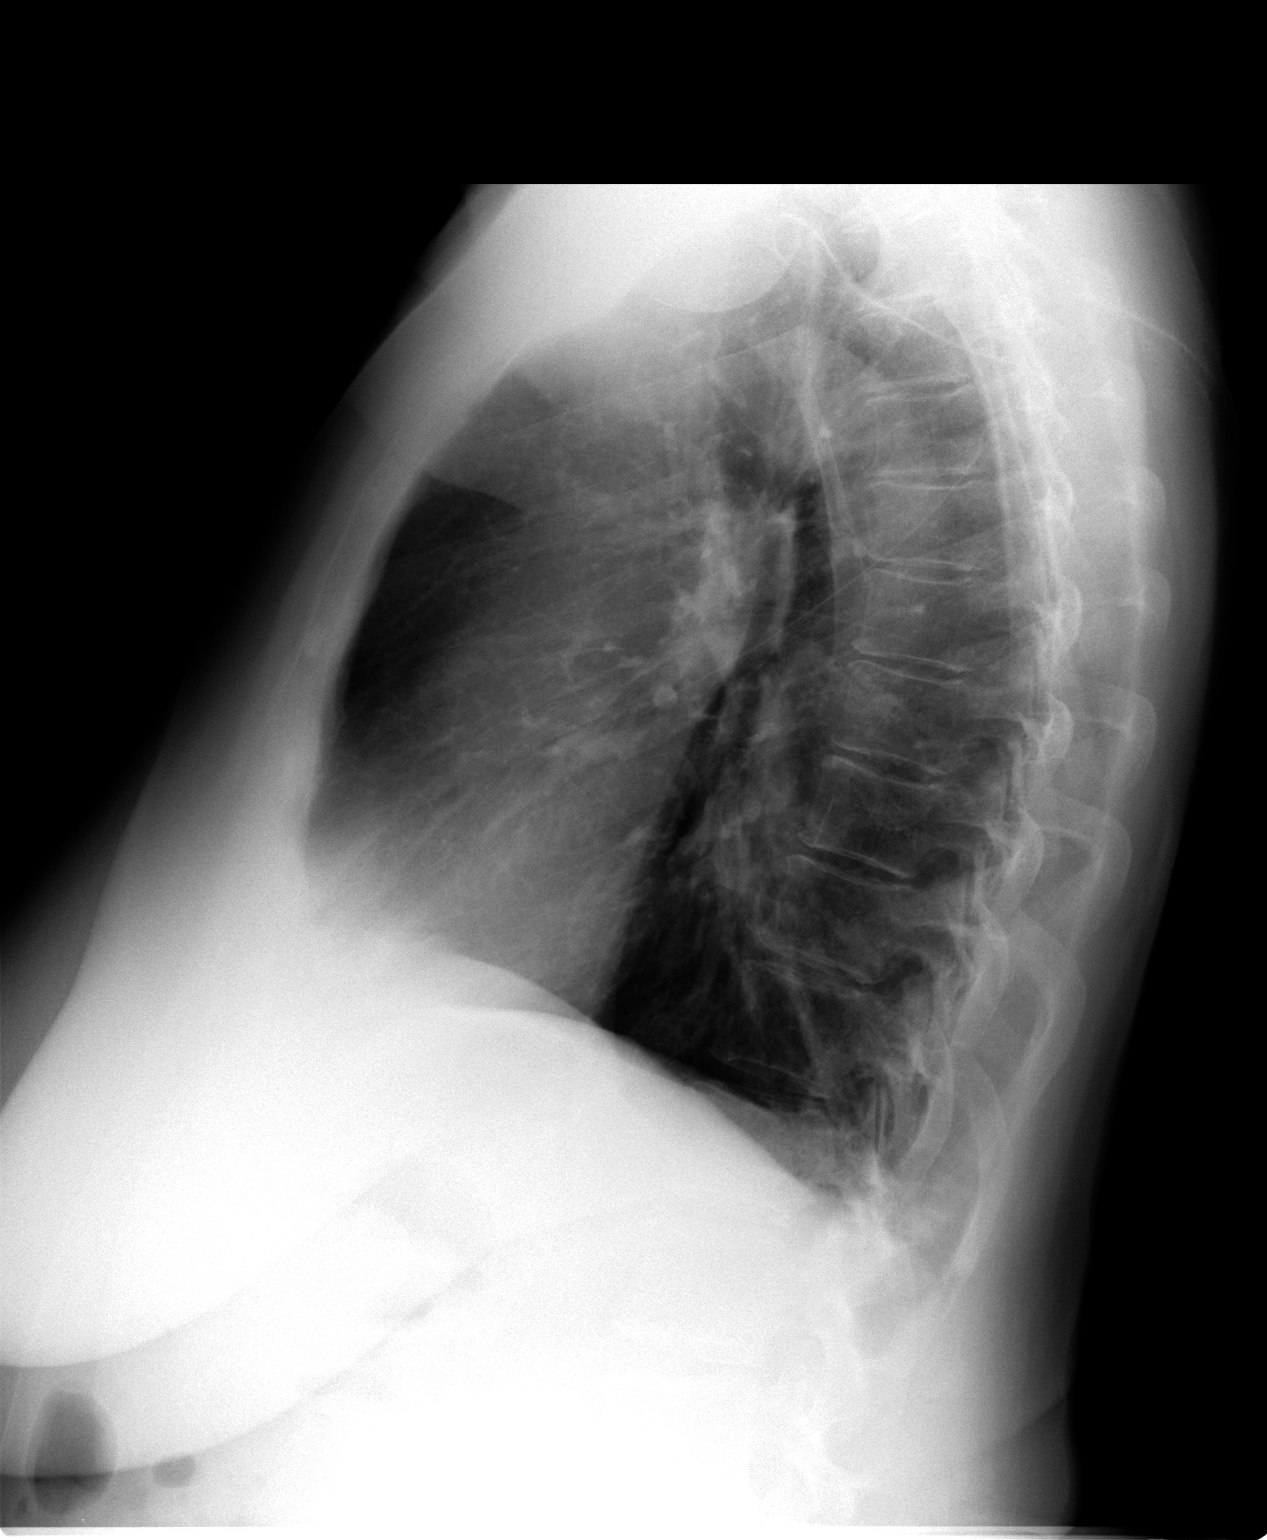

[2 of 2 positions shown; findings below may reference images not displayed]

FINDINGS: The lungs are mildly hyperinflated with mild hemidiaphragm
flattening. The heart and pulmonary vascularity are normal. The
mediastinum is normal in width. There is no pleural effusion. The
bony thorax exhibits no acute abnormality.
IMPRESSION: Mild hyperinflation consistent with COPD. There is no active
cardiopulmonary disease.

## 2017-07-27 ENCOUNTER — Other Ambulatory Visit: Payer: Self-pay | Admitting: Internal Medicine

## 2017-07-27 DIAGNOSIS — J449 Chronic obstructive pulmonary disease, unspecified: Secondary | ICD-10-CM

## 2017-07-28 ENCOUNTER — Other Ambulatory Visit: Payer: Self-pay | Admitting: Internal Medicine

## 2017-07-28 DIAGNOSIS — R0602 Shortness of breath: Secondary | ICD-10-CM

## 2017-07-28 DIAGNOSIS — J449 Chronic obstructive pulmonary disease, unspecified: Secondary | ICD-10-CM

## 2017-07-28 DIAGNOSIS — J45909 Unspecified asthma, uncomplicated: Secondary | ICD-10-CM

## 2017-07-29 ENCOUNTER — Ambulatory Visit (HOSPITAL_COMMUNITY): Payer: Federal, State, Local not specified - PPO

## 2017-07-29 ENCOUNTER — Ambulatory Visit
Admission: RE | Admit: 2017-07-29 | Discharge: 2017-07-29 | Disposition: A | Discharge status: Home / Self Care | Payer: Federal, State, Local not specified - PPO | Source: Ambulatory Visit | Attending: Internal Medicine | Admitting: Internal Medicine

## 2017-07-29 DIAGNOSIS — J449 Chronic obstructive pulmonary disease, unspecified: Secondary | ICD-10-CM | POA: Insufficient documentation

## 2017-07-29 DIAGNOSIS — J45909 Unspecified asthma, uncomplicated: Secondary | ICD-10-CM | POA: Diagnosis not present

## 2017-07-29 DIAGNOSIS — R0602 Shortness of breath: Secondary | ICD-10-CM | POA: Insufficient documentation

## 2017-07-29 MED ORDER — ALBUTEROL SULFATE (2.5 MG/3ML) 0.083% IN NEBU
2.5000 mg | INHALATION_SOLUTION | Freq: Once | RESPIRATORY_TRACT | Status: AC
  Administered 2017-07-29: 2.5 mg via RESPIRATORY_TRACT

## 2017-08-11 ENCOUNTER — Other Ambulatory Visit: Payer: Self-pay | Admitting: Primary Care

## 2017-08-11 DIAGNOSIS — R05 Cough: Secondary | ICD-10-CM

## 2017-08-11 DIAGNOSIS — R059 Cough, unspecified: Secondary | ICD-10-CM

## 2017-08-11 DIAGNOSIS — R0602 Shortness of breath: Secondary | ICD-10-CM

## 2017-10-20 ENCOUNTER — Encounter: Payer: Self-pay | Admitting: Radiology

## 2017-11-29 ENCOUNTER — Other Ambulatory Visit (HOSPITAL_COMMUNITY): Payer: Self-pay | Admitting: Obstetrics & Gynecology

## 2017-11-29 DIAGNOSIS — Z1231 Encounter for screening mammogram for malignant neoplasm of breast: Secondary | ICD-10-CM

## 2017-12-22 ENCOUNTER — Encounter: Payer: Self-pay | Admitting: Obstetrics & Gynecology

## 2017-12-22 ENCOUNTER — Other Ambulatory Visit: Payer: Self-pay | Admitting: Primary Care

## 2017-12-22 ENCOUNTER — Ambulatory Visit (INDEPENDENT_AMBULATORY_CARE_PROVIDER_SITE_OTHER): Payer: Federal, State, Local not specified - PPO | Admitting: Obstetrics & Gynecology

## 2017-12-22 VITALS — BP 155/85 | HR 66 | Resp 16 | Ht 65.5 in | Wt 167.4 lb

## 2017-12-22 DIAGNOSIS — Z23 Encounter for immunization: Secondary | ICD-10-CM

## 2017-12-22 DIAGNOSIS — I10 Essential (primary) hypertension: Secondary | ICD-10-CM

## 2017-12-22 DIAGNOSIS — Z01419 Encounter for gynecological examination (general) (routine) without abnormal findings: Secondary | ICD-10-CM

## 2017-12-22 NOTE — Patient Instructions (Signed)
Tdap Vaccine (Tetanus, Diphtheria and Pertussis): What You Need to Know 1. Why get vaccinated? Tetanus, diphtheria and pertussis are very serious diseases. Tdap vaccine can protect us from these diseases. And, Tdap vaccine given to pregnant women can protect newborn babies against pertussis. TETANUS (Lockjaw) is rare in the [place] today. It causes painful muscle tightening and stiffness, usually all over the body.  It can lead to tightening of muscles in the head and neck so you can't open your mouth, swallow, or sometimes even breathe. Tetanus kills about 1 out of 10 people who are infected even after receiving the best medical care.  DIPHTHERIA is also rare in the [place] today. It can cause a thick coating to form in the back of the throat.  It can lead to breathing problems, heart failure, paralysis, and death.  PERTUSSIS (Whooping Cough) causes severe coughing spells, which can cause difficulty breathing, vomiting and disturbed sleep.  It can also lead to weight loss, incontinence, and rib fractures. Up to 2 in 100 adolescents and 5 in 100 adults with pertussis are hospitalized or have complications, which could include pneumonia or death.  These diseases are caused by bacteria. Diphtheria and pertussis are spread from person to person through secretions from coughing or sneezing. Tetanus enters the body through cuts, scratches, or wounds. Before vaccines, as many as 200,000 cases of diphtheria, 200,000 cases of pertussis, and hundreds of cases of tetanus, were reported in the [place] each year. Since vaccination began, reports of cases for tetanus and diphtheria have dropped by about 99% and for pertussis by about 80%. 2. Tdap vaccine Tdap vaccine can protect adolescents and adults from tetanus, diphtheria, and pertussis. One dose of Tdap is routinely given at age 11 or 12. People who did not get Tdap at that age should get it as soon as possible. Tdap is especially  important for healthcare professionals and anyone having close contact with a baby younger than 12 months. Pregnant women should get a dose of Tdap during every pregnancy, to protect the newborn from pertussis. Infants are most at risk for severe, life-threatening complications from pertussis. Another vaccine, called Td, protects against tetanus and diphtheria, but not pertussis. A Td booster should be given every 10 years. Tdap may be given as one of these boosters if you have never gotten Tdap before. Tdap may also be given after a severe cut or burn to prevent tetanus infection. Your doctor or the person giving you the vaccine can give you more information. Tdap may safely be given at the same time as other vaccines. 3. Some people should not get this vaccine  A person who has ever had a life-threatening allergic reaction after a previous dose of any diphtheria, tetanus or pertussis containing vaccine, OR has a severe allergy to any part of this vaccine, should not get Tdap vaccine. Tell the person giving the vaccine about any severe allergies.  Anyone who had coma or long repeated seizures within 7 days after a childhood dose of DTP or DTaP, or a previous dose of Tdap, should not get Tdap, unless a cause other than the vaccine was found. They can still get Td.  Talk to your doctor if you: ? have seizures or another nervous system problem, ? had severe pain or swelling after any vaccine containing diphtheria, tetanus or pertussis, ? ever had a condition called Guillain-Barr Syndrome (GBS), ? aren't feeling well on the day the shot is scheduled. 4. Risks With any medicine, including   vaccines, there is a chance of side effects. These are usually mild and go away on their own. Serious reactions are also possible but are rare. Most people who get Tdap vaccine do not have any problems with it. Mild problems following Tdap: (Did not interfere with activities)  Pain where the shot was given (about  3 in 4 adolescents or 2 in 3 adults)  Redness or swelling where the shot was given (about 1 person in 5)  Mild fever of at least 100.4F (up to about 1 in 25 adolescents or 1 in 100 adults)  Headache (about 3 or 4 people in 10)  Tiredness (about 1 person in 3 or 4)  Nausea, vomiting, diarrhea, stomach ache (up to 1 in 4 adolescents or 1 in 10 adults)  Chills, sore joints (about 1 person in 10)  Body aches (about 1 person in 3 or 4)  Rash, swollen glands (uncommon)  Moderate problems following Tdap: (Interfered with activities, but did not require medical attention)  Pain where the shot was given (up to 1 in 5 or 6)  Redness or swelling where the shot was given (up to about 1 in 16 adolescents or 1 in 12 adults)  Fever over 102F (about 1 in 100 adolescents or 1 in 250 adults)  Headache (about 1 in 7 adolescents or 1 in 10 adults)  Nausea, vomiting, diarrhea, stomach ache (up to 1 or 3 people in 100)  Swelling of the entire arm where the shot was given (up to about 1 in 500).  Severe problems following Tdap: (Unable to perform usual activities; required medical attention)  Swelling, severe pain, bleeding and redness in the arm where the shot was given (rare).  Problems that could happen after any vaccine:  People sometimes faint after a medical procedure, including vaccination. Sitting or lying down for about 15 minutes can help prevent fainting, and injuries caused by a fall. Tell your doctor if you feel dizzy, or have vision changes or ringing in the ears.  Some people get severe pain in the shoulder and have difficulty moving the arm where a shot was given. This happens very rarely.  Any medication can cause a severe allergic reaction. Such reactions from a vaccine are very rare, estimated at fewer than 1 in a million doses, and would happen within a few minutes to a few hours after the vaccination. As with any medicine, there is a very remote chance of a vaccine  causing a serious injury or death. The safety of vaccines is always being monitored. For more information, visit: www.cdc.gov/vaccinesafety/ 5. What if there is a serious problem? What should I look for? Look for anything that concerns you, such as signs of a severe allergic reaction, very high fever, or unusual behavior. Signs of a severe allergic reaction can include hives, swelling of the face and throat, difficulty breathing, a fast heartbeat, dizziness, and weakness. These would usually start a few minutes to a few hours after the vaccination. What should I do?  If you think it is a severe allergic reaction or other emergency that can't wait, call 9-1-1 or get the person to the nearest hospital. Otherwise, call your doctor.  Afterward, the reaction should be reported to the Vaccine Adverse Event Reporting System (VAERS). Your doctor might file this report, or you can do it yourself through the VAERS web site at www.vaers.hhs.gov, or by calling 1-800-822-7967. ? VAERS does not give medical advice. 6. The National Vaccine Injury Compensation Program The National   Vaccine Injury Compensation Program (VICP) is a federal program that was created to compensate people who may have been injured by certain vaccines. Persons who believe they may have been injured by a vaccine can learn about the program and about filing a claim by calling 1-800-338-2382 or visiting the VICP website at www.hrsa.gov/vaccinecompensation. There is a time limit to file a claim for compensation. 7. How can I learn more?  Ask your doctor. He or she can give you the vaccine package insert or suggest other sources of information.  Call your local or state health department.  Contact the Centers for Disease Control and Prevention (CDC): ? Call 1-800-232-4636 (1-800-CDC-INFO) or ? Visit CDC's website at www.cdc.gov/vaccines CDC Tdap Vaccine VIS (05/30/13) This information is not intended to replace advice given to you by your  health care provider. Make sure you discuss any questions you have with your health care provider. Document Released: 09/22/2011 Document Revised: 12/12/2015 Document Reviewed: 12/12/2015 Elsevier Interactive Patient Education  2017 [employer]  

## 2017-12-22 NOTE — Progress Notes (Signed)
Subjective:    Zoe Vaughn is a 72 y.o. married P3 (3 grands)  female who presents for an annual exam. The patient has no complaints today. The patient is not currently sexually active. GYN screening history: last pap: was normal. The patient wears seatbelts: yes. The patient participates in regular exercise: yes. Has the patient ever been transfused or tattooed?: no. The patient reports that there is not domestic violence in her life.   Menstrual History: OB History    Gravida  3   Para  3   Term  3   Preterm      AB      Living  3     SAB      TAB      Ectopic      Multiple      Live Births  3           Menarche age: 89 No LMP recorded. Patient is postmenopausal.    The following portions of the patient's history were reviewed and updated as appropriate: allergies, current medications, past family history, past medical history, past social history, past surgical history and problem list.  Review of Systems Pertinent items are noted in HPI.   FH- no breast/gyn/colon cancer S/p colonoscopy Normal DEXA last year Has a Fam med doc Her husband is dealing with dementia onset   Objective:    BP (!) 155/85 (BP Location: Right Arm, Cuff Size: Normal)   Pulse 66   Resp 16   Ht 5' 5.5" (1.664 m)   Wt 167 lb 6.4 oz (75.9 kg)   BMI 27.43 kg/m   General Appearance:    Alert, cooperative, no distress, appears stated age  Head:    Normocephalic, without obvious abnormality, atraumatic  Eyes:    PERRL, conjunctiva/corneas clear, EOM's intact, fundi    benign, both eyes  Ears:    Normal TM's and external ear canals, both ears  Nose:   Nares normal, septum midline, mucosa normal, no drainage    or sinus tenderness  Throat:   Lips, mucosa, and tongue normal; teeth and gums normal  Neck:   Supple, symmetrical, trachea midline, no adenopathy;    thyroid:  no enlargement/tenderness/nodules; no carotid   bruit or JVD  Back:     Symmetric, no curvature, ROM normal,  no CVA tenderness  Lungs:     Clear to auscultation bilaterally, respirations unlabored  Chest Wall:    No tenderness or deformity   Heart:    Regular rate and rhythm, S1 and S2 normal, no murmur, rub   or gallop  Breast Exam:    No tenderness, masses, or nipple abnormality  Abdomen:     Soft, non-tender, bowel sounds active all four quadrants,    no masses, no organomegaly  Genitalia:    Normal female without lesion, discharge or tenderness, severe vulvovaginal atrophy, normal size and shape, anteverted, mobile, non-tender, normal adnexal exam      Extremities:   Extremities normal, atraumatic, no cyanosis or edema  Pulses:   2+ and symmetric all extremities  Skin:   Skin color, texture, turgor normal, no rashes or lesions  Lymph nodes:   Cervical, supraclavicular, and axillary nodes normal  Neurologic:   CNII-XII intact, normal strength, sensation and reflexes    throughout  .    Assessment:    Healthy female exam.    Plan:     Mammogram.

## 2017-12-23 ENCOUNTER — Other Ambulatory Visit: Payer: Self-pay | Admitting: Primary Care

## 2017-12-23 DIAGNOSIS — I10 Essential (primary) hypertension: Secondary | ICD-10-CM

## 2018-01-11 ENCOUNTER — Ambulatory Visit
Admission: RE | Admit: 2018-01-11 | Discharge: 2018-01-11 | Disposition: A | Discharge status: Home / Self Care | Payer: Federal, State, Local not specified - PPO | Source: Ambulatory Visit | Attending: Obstetrics & Gynecology | Admitting: Obstetrics & Gynecology

## 2018-01-11 DIAGNOSIS — Z1231 Encounter for screening mammogram for malignant neoplasm of breast: Secondary | ICD-10-CM | POA: Diagnosis not present

## 2018-06-28 ENCOUNTER — Other Ambulatory Visit: Payer: Self-pay | Admitting: Primary Care

## 2018-06-28 DIAGNOSIS — R0602 Shortness of breath: Secondary | ICD-10-CM

## 2018-06-28 DIAGNOSIS — R059 Cough, unspecified: Secondary | ICD-10-CM

## 2018-06-28 DIAGNOSIS — R05 Cough: Secondary | ICD-10-CM

## 2018-10-20 ENCOUNTER — Encounter: Payer: Self-pay | Admitting: Radiology

## 2018-11-01 ENCOUNTER — Telehealth: Payer: Self-pay | Admitting: Radiology

## 2018-11-01 NOTE — Telephone Encounter (Signed)
Spoke with patient, she has moved to [facility], received return reminder letter for Annual exam. I explained that once she obtains new healthcare provider there to send medical records release for medical records to be forwarded to new provider
